# Patient Record
Sex: Female | Born: 1960 | Race: White | Hispanic: No | Marital: Married | Smoking: Never smoker
Health system: Southern US, Community
[De-identification: ages and names within clinical notes are randomized; demographics above are authoritative.]

## PROBLEM LIST (undated history)

## (undated) DIAGNOSIS — D649 Anemia, unspecified: Secondary | ICD-10-CM

## (undated) DIAGNOSIS — E559 Vitamin D deficiency, unspecified: Secondary | ICD-10-CM

## (undated) DIAGNOSIS — M1990 Unspecified osteoarthritis, unspecified site: Secondary | ICD-10-CM

## (undated) DIAGNOSIS — R609 Edema, unspecified: Secondary | ICD-10-CM

## (undated) DIAGNOSIS — I10 Essential (primary) hypertension: Secondary | ICD-10-CM

## (undated) DIAGNOSIS — Z9289 Personal history of other medical treatment: Secondary | ICD-10-CM

## (undated) DIAGNOSIS — F419 Anxiety disorder, unspecified: Secondary | ICD-10-CM

## (undated) DIAGNOSIS — M069 Rheumatoid arthritis, unspecified: Secondary | ICD-10-CM

## (undated) DIAGNOSIS — Z9889 Other specified postprocedural states: Secondary | ICD-10-CM

## (undated) DIAGNOSIS — I319 Disease of pericardium, unspecified: Secondary | ICD-10-CM

## (undated) DIAGNOSIS — K829 Disease of gallbladder, unspecified: Secondary | ICD-10-CM

## (undated) DIAGNOSIS — K5792 Diverticulitis of intestine, part unspecified, without perforation or abscess without bleeding: Secondary | ICD-10-CM

## (undated) HISTORY — DX: Edema, unspecified: R60.9

## (undated) HISTORY — DX: Disease of gallbladder, unspecified: K82.9

## (undated) HISTORY — DX: Rheumatoid arthritis, unspecified: M06.9

## (undated) HISTORY — PX: BACK SURGERY: SHX140

## (undated) HISTORY — DX: Vitamin D deficiency, unspecified: E55.9

## (undated) HISTORY — PX: ABDOMINAL HYSTERECTOMY: SHX81

## (undated) HISTORY — PX: ROTATOR CUFF REPAIR: SHX139

## (undated) HISTORY — PX: CHOLECYSTECTOMY: SHX55

---

## 1998-08-06 ENCOUNTER — Observation Stay (HOSPITAL_COMMUNITY): Admission: RE | Admit: 1998-08-06 | Discharge: 1998-08-07 | Payer: Self-pay | Admitting: Neurosurgery

## 1998-09-25 ENCOUNTER — Other Ambulatory Visit: Admission: RE | Admit: 1998-09-25 | Discharge: 1998-09-25 | Payer: Self-pay | Admitting: Obstetrics and Gynecology

## 1998-10-12 ENCOUNTER — Inpatient Hospital Stay (HOSPITAL_COMMUNITY): Admission: RE | Admit: 1998-10-12 | Discharge: 1998-10-14 | Payer: Self-pay | Admitting: Obstetrics and Gynecology

## 1998-10-26 ENCOUNTER — Emergency Department (HOSPITAL_COMMUNITY): Admission: EM | Admit: 1998-10-26 | Discharge: 1998-10-27 | Payer: Self-pay | Admitting: Emergency Medicine

## 1998-12-03 ENCOUNTER — Inpatient Hospital Stay (HOSPITAL_COMMUNITY): Admission: EM | Admit: 1998-12-03 | Discharge: 1998-12-05 | Payer: Self-pay | Admitting: Emergency Medicine

## 1998-12-04 ENCOUNTER — Encounter: Payer: Self-pay | Admitting: Urology

## 2002-08-26 ENCOUNTER — Emergency Department (HOSPITAL_COMMUNITY): Admission: EM | Admit: 2002-08-26 | Discharge: 2002-08-26 | Payer: Self-pay | Admitting: Emergency Medicine

## 2002-08-26 ENCOUNTER — Encounter: Payer: Self-pay | Admitting: Emergency Medicine

## 2005-02-05 ENCOUNTER — Ambulatory Visit: Payer: Self-pay | Admitting: Family Medicine

## 2007-08-19 ENCOUNTER — Ambulatory Visit (HOSPITAL_COMMUNITY): Admission: RE | Admit: 2007-08-19 | Discharge: 2007-08-19 | Payer: Self-pay | Admitting: Family Medicine

## 2007-08-25 ENCOUNTER — Ambulatory Visit (HOSPITAL_COMMUNITY): Admission: RE | Admit: 2007-08-25 | Discharge: 2007-08-25 | Payer: Self-pay | Admitting: Family Medicine

## 2008-09-12 ENCOUNTER — Ambulatory Visit (HOSPITAL_COMMUNITY): Admission: RE | Admit: 2008-09-12 | Discharge: 2008-09-12 | Payer: Self-pay | Admitting: Family Medicine

## 2010-08-18 ENCOUNTER — Encounter: Payer: Self-pay | Admitting: Family Medicine

## 2014-03-27 DIAGNOSIS — Z6841 Body Mass Index (BMI) 40.0 and over, adult: Secondary | ICD-10-CM | POA: Insufficient documentation

## 2014-05-06 DIAGNOSIS — K5730 Diverticulosis of large intestine without perforation or abscess without bleeding: Secondary | ICD-10-CM | POA: Insufficient documentation

## 2014-05-09 DIAGNOSIS — D126 Benign neoplasm of colon, unspecified: Secondary | ICD-10-CM | POA: Insufficient documentation

## 2014-06-29 DIAGNOSIS — E785 Hyperlipidemia, unspecified: Secondary | ICD-10-CM | POA: Insufficient documentation

## 2014-07-05 DIAGNOSIS — N390 Urinary tract infection, site not specified: Secondary | ICD-10-CM | POA: Insufficient documentation

## 2014-07-05 DIAGNOSIS — R339 Retention of urine, unspecified: Secondary | ICD-10-CM | POA: Insufficient documentation

## 2015-02-26 DIAGNOSIS — I319 Disease of pericardium, unspecified: Secondary | ICD-10-CM

## 2015-02-26 HISTORY — DX: Disease of pericardium, unspecified: I31.9

## 2015-03-02 ENCOUNTER — Emergency Department (HOSPITAL_COMMUNITY): Payer: Self-pay

## 2015-03-02 ENCOUNTER — Encounter (HOSPITAL_COMMUNITY): Payer: Self-pay | Admitting: Emergency Medicine

## 2015-03-02 ENCOUNTER — Inpatient Hospital Stay (HOSPITAL_COMMUNITY)
Admission: EM | Admit: 2015-03-02 | Discharge: 2015-03-04 | DRG: 287 | Disposition: A | Discharge status: Home / Self Care | Payer: Self-pay | Attending: Cardiology | Admitting: Cardiology

## 2015-03-02 ENCOUNTER — Encounter (HOSPITAL_COMMUNITY)
Admission: EM | Disposition: A | Discharge status: Home / Self Care | Payer: Self-pay | Source: Home / Self Care | Attending: Cardiology

## 2015-03-02 DIAGNOSIS — Z792 Long term (current) use of antibiotics: Secondary | ICD-10-CM

## 2015-03-02 DIAGNOSIS — I309 Acute pericarditis, unspecified: Principal | ICD-10-CM | POA: Diagnosis present

## 2015-03-02 DIAGNOSIS — R079 Chest pain, unspecified: Secondary | ICD-10-CM | POA: Diagnosis present

## 2015-03-02 DIAGNOSIS — R9431 Abnormal electrocardiogram [ECG] [EKG]: Secondary | ICD-10-CM | POA: Diagnosis present

## 2015-03-02 DIAGNOSIS — D649 Anemia, unspecified: Secondary | ICD-10-CM | POA: Diagnosis present

## 2015-03-02 DIAGNOSIS — I200 Unstable angina: Secondary | ICD-10-CM

## 2015-03-02 DIAGNOSIS — Z6841 Body Mass Index (BMI) 40.0 and over, adult: Secondary | ICD-10-CM

## 2015-03-02 HISTORY — DX: Anemia, unspecified: D64.9

## 2015-03-02 HISTORY — DX: Personal history of other medical treatment: Z92.89

## 2015-03-02 HISTORY — DX: Diverticulitis of intestine, part unspecified, without perforation or abscess without bleeding: K57.92

## 2015-03-02 HISTORY — DX: Disease of pericardium, unspecified: I31.9

## 2015-03-02 HISTORY — DX: Other specified postprocedural states: Z98.890

## 2015-03-02 HISTORY — PX: CARDIAC CATHETERIZATION: SHX172

## 2015-03-02 LAB — BASIC METABOLIC PANEL WITH GFR
Anion gap: 13 (ref 5–15)
BUN: 8 mg/dL (ref 6–20)
CO2: 24 mmol/L (ref 22–32)
Calcium: 9 mg/dL (ref 8.9–10.3)
Chloride: 102 mmol/L (ref 101–111)
Creatinine, Ser: 0.8 mg/dL (ref 0.44–1.00)
GFR calc Af Amer: >60 mL/min
GFR calc non Af Amer: >60 mL/min
Glucose, Bld: 97 mg/dL (ref 65–99)
Potassium: 3.4 mmol/L — ABNORMAL LOW (ref 3.5–5.1)
Sodium: 139 mmol/L (ref 135–145)

## 2015-03-02 LAB — CBC
HCT: 37.4 % (ref 36.0–46.0)
HEMATOCRIT: 34.4 % — AB (ref 36.0–46.0)
HEMOGLOBIN: 11.5 g/dL — AB (ref 12.0–15.0)
Hemoglobin: 12.5 g/dL (ref 12.0–15.0)
MCH: 28.4 pg (ref 26.0–34.0)
MCH: 28.5 pg (ref 26.0–34.0)
MCHC: 33.4 g/dL (ref 30.0–36.0)
MCHC: 33.4 g/dL (ref 30.0–36.0)
MCV: 85 fL (ref 78.0–100.0)
MCV: 85.4 fL (ref 78.0–100.0)
Platelets: 239 K/uL (ref 150–400)
Platelets: 219 10*3/uL (ref 150–400)
RBC: 4.4 MIL/uL (ref 3.87–5.11)
RBC: 4.03 MIL/uL (ref 3.87–5.11)
RDW: 13.2 % (ref 11.5–15.5)
RDW: 13.4 % (ref 11.5–15.5)
WBC: 6.2 K/uL (ref 4.0–10.5)
WBC: 7.7 10*3/uL (ref 4.0–10.5)

## 2015-03-02 LAB — TROPONIN I
Troponin I: <0.03 ng/mL
Troponin I: <0.03 ng/mL (ref <0.031)

## 2015-03-02 LAB — I-STAT TROPONIN, ED
TROPONIN I, POC: 0 ng/mL (ref 0.00–0.08)
Troponin i, poc: 0 ng/mL (ref 0.00–0.08)

## 2015-03-02 LAB — CREATININE, SERUM
CREATININE: 0.8 mg/dL (ref 0.44–1.00)
GFR calc Af Amer: >60 mL/min (ref >60)
GFR calc non Af Amer: >60 mL/min (ref >60)

## 2015-03-02 LAB — PROTIME-INR
INR: 1.07 (ref 0.00–1.49)
PROTHROMBIN TIME: 14.1 s (ref 11.6–15.2)

## 2015-03-02 LAB — D-DIMER, QUANTITATIVE: D-Dimer, Quant: 0.88 ug{FEU}/mL — ABNORMAL HIGH (ref 0.00–0.48)

## 2015-03-02 LAB — BRAIN NATRIURETIC PEPTIDE: B Natriuretic Peptide: 63.3 pg/mL (ref 0.0–100.0)

## 2015-03-02 LAB — APTT: aPTT: 28 seconds (ref 24–37)

## 2015-03-02 LAB — TSH: TSH: 2.831 u[IU]/mL (ref 0.350–4.500)

## 2015-03-02 SURGERY — LEFT HEART CATH AND CORONARY ANGIOGRAPHY

## 2015-03-02 MED ORDER — METRONIDAZOLE 500 MG PO TABS
500.0000 mg | ORAL_TABLET | Freq: Three times a day (TID) | ORAL | Status: DC
  Administered 2015-03-02 – 2015-03-04 (×5): 500 mg via ORAL
  Filled 2015-03-02 (×4): qty 1

## 2015-03-02 MED ORDER — SODIUM CHLORIDE 0.9 % IJ SOLN: 3.0000 mL | INTRAMUSCULAR | Status: DC | PRN

## 2015-03-02 MED ORDER — SODIUM CHLORIDE 0.9 % IV SOLN: 250.0000 mL | INTRAVENOUS | Status: DC | PRN

## 2015-03-02 MED ORDER — POLYETHYLENE GLYCOL 3350 17 G PO PACK
17.0000 g | PACK | Freq: Every day | ORAL | Status: DC
  Administered 2015-03-04: 17 g via ORAL
  Filled 2015-03-02: qty 1

## 2015-03-02 MED ORDER — ASPIRIN EC 81 MG PO TBEC
81.0000 mg | DELAYED_RELEASE_TABLET | Freq: Every day | ORAL | Status: DC
  Administered 2015-03-03 – 2015-03-04 (×2): 81 mg via ORAL
  Filled 2015-03-02: qty 1

## 2015-03-02 MED ORDER — HEPARIN (PORCINE) IN NACL 100-0.45 UNIT/ML-% IJ SOLN
1100.0000 [IU]/h | INTRAMUSCULAR | Status: DC
  Administered 2015-03-02: 1100 [IU]/h via INTRAVENOUS
  Filled 2015-03-02: qty 250

## 2015-03-02 MED ORDER — SODIUM CHLORIDE 0.9 % IJ SOLN: 3.0000 mL | Freq: Two times a day (BID) | INTRAMUSCULAR | Status: DC

## 2015-03-02 MED ORDER — ONDANSETRON HCL 4 MG/2ML IJ SOLN
4.0000 mg | Freq: Once | INTRAMUSCULAR | Status: AC
  Administered 2015-03-02: 4 mg via INTRAVENOUS
  Filled 2015-03-02: qty 2

## 2015-03-02 MED ORDER — NITROGLYCERIN 1 MG/10 ML FOR IR/CATH LAB
INTRA_ARTERIAL | Status: AC
  Filled 2015-03-02: qty 10

## 2015-03-02 MED ORDER — HEPARIN SODIUM (PORCINE) 1000 UNIT/ML IJ SOLN
INTRAMUSCULAR | Status: DC | PRN
  Administered 2015-03-02: 6000 [IU] via INTRAVENOUS

## 2015-03-02 MED ORDER — FENTANYL CITRATE (PF) 100 MCG/2ML IJ SOLN
INTRAMUSCULAR | Status: DC | PRN
  Administered 2015-03-02: 50 ug via INTRAVENOUS

## 2015-03-02 MED ORDER — ASPIRIN 81 MG PO CHEW
324.0000 mg | CHEWABLE_TABLET | ORAL | Status: DC
Start: 2015-03-02 — End: 2015-03-02

## 2015-03-02 MED ORDER — ATORVASTATIN CALCIUM 80 MG PO TABS: 80.0000 mg | ORAL_TABLET | Freq: Every day | ORAL | Status: DC

## 2015-03-02 MED ORDER — LORAZEPAM 2 MG/ML IJ SOLN
1.0000 mg | Freq: Once | INTRAMUSCULAR | Status: AC
  Administered 2015-03-02: 1 mg via INTRAVENOUS
  Filled 2015-03-02: qty 1

## 2015-03-02 MED ORDER — SODIUM CHLORIDE 0.9 % WEIGHT BASED INFUSION: 3.0000 mL/kg/h | INTRAVENOUS | Status: AC

## 2015-03-02 MED ORDER — ENOXAPARIN SODIUM 40 MG/0.4ML ~~LOC~~ SOLN
40.0000 mg | SUBCUTANEOUS | Status: DC
  Administered 2015-03-03 – 2015-03-04 (×2): 40 mg via SUBCUTANEOUS
  Filled 2015-03-02 (×2): qty 0.4

## 2015-03-02 MED ORDER — OXYCODONE-ACETAMINOPHEN 5-325 MG PO TABS
1.0000 | ORAL_TABLET | ORAL | Status: DC | PRN
  Administered 2015-03-02 – 2015-03-04 (×6): 2 via ORAL
  Filled 2015-03-02 (×6): qty 2

## 2015-03-02 MED ORDER — MORPHINE SULFATE 4 MG/ML IJ SOLN
4.0000 mg | Freq: Once | INTRAMUSCULAR | Status: AC
  Administered 2015-03-02: 4 mg via INTRAVENOUS
  Filled 2015-03-02: qty 1

## 2015-03-02 MED ORDER — LIDOCAINE VISCOUS 2 % MT SOLN
15.0000 mL | Freq: Once | OROMUCOSAL | Status: AC
  Administered 2015-03-02: 15 mL via OROMUCOSAL
  Filled 2015-03-02: qty 15

## 2015-03-02 MED ORDER — SODIUM CHLORIDE 0.9 % IV SOLN: INTRAVENOUS | Status: AC

## 2015-03-02 MED ORDER — SODIUM CHLORIDE 0.9 % IJ SOLN
INTRAMUSCULAR | Status: DC | PRN
  Administered 2015-03-02: 18:00:00 via INTRA_ARTERIAL

## 2015-03-02 MED ORDER — LIDOCAINE HCL (PF) 1 % IJ SOLN
INTRAMUSCULAR | Status: AC
  Filled 2015-03-02: qty 30

## 2015-03-02 MED ORDER — HEPARIN BOLUS VIA INFUSION
4000.0000 [IU] | Freq: Once | INTRAVENOUS | Status: AC
  Administered 2015-03-02: 4000 [IU] via INTRAVENOUS
  Filled 2015-03-02: qty 4000

## 2015-03-02 MED ORDER — PANTOPRAZOLE SODIUM 40 MG IV SOLR
40.0000 mg | Freq: Once | INTRAVENOUS | Status: AC
  Administered 2015-03-02: 40 mg via INTRAVENOUS
  Filled 2015-03-02: qty 40

## 2015-03-02 MED ORDER — MIDAZOLAM HCL 2 MG/2ML IJ SOLN
INTRAMUSCULAR | Status: AC
  Filled 2015-03-02: qty 4

## 2015-03-02 MED ORDER — ALUM & MAG HYDROXIDE-SIMETH 200-200-20 MG/5ML PO SUSP
15.0000 mL | Freq: Once | ORAL | Status: AC
  Administered 2015-03-02: 15 mL via ORAL
  Filled 2015-03-02: qty 30

## 2015-03-02 MED ORDER — LIDOCAINE HCL (PF) 1 % IJ SOLN
INTRAMUSCULAR | Status: DC | PRN
  Administered 2015-03-02: 18:00:00

## 2015-03-02 MED ORDER — ACETAMINOPHEN 325 MG PO TABS: 650.0000 mg | ORAL_TABLET | ORAL | Status: DC | PRN

## 2015-03-02 MED ORDER — IOHEXOL 350 MG/ML SOLN
100.0000 mL | Freq: Once | INTRAVENOUS | Status: AC | PRN
  Administered 2015-03-02: 100 mL via INTRAVENOUS

## 2015-03-02 MED ORDER — HEPARIN (PORCINE) IN NACL 2-0.9 UNIT/ML-% IJ SOLN
INTRAMUSCULAR | Status: AC
  Filled 2015-03-02: qty 1000

## 2015-03-02 MED ORDER — HEPARIN (PORCINE) IN NACL 2-0.9 UNIT/ML-% IJ SOLN
INTRAMUSCULAR | Status: AC
  Filled 2015-03-02: qty 500

## 2015-03-02 MED ORDER — HEPARIN SODIUM (PORCINE) 1000 UNIT/ML IJ SOLN
INTRAMUSCULAR | Status: AC
  Filled 2015-03-02: qty 1

## 2015-03-02 MED ORDER — VERAPAMIL HCL 2.5 MG/ML IV SOLN
INTRAVENOUS | Status: AC
  Filled 2015-03-02: qty 2

## 2015-03-02 MED ORDER — CIPROFLOXACIN HCL 500 MG PO TABS
500.0000 mg | ORAL_TABLET | Freq: Two times a day (BID) | ORAL | Status: DC
  Administered 2015-03-02 – 2015-03-04 (×4): 500 mg via ORAL
  Filled 2015-03-02 (×4): qty 1

## 2015-03-02 MED ORDER — MIDAZOLAM HCL 2 MG/2ML IJ SOLN
INTRAMUSCULAR | Status: DC | PRN
  Administered 2015-03-02: 1 mg via INTRAVENOUS

## 2015-03-02 MED ORDER — ONDANSETRON HCL 4 MG/2ML IJ SOLN: 4.0000 mg | Freq: Four times a day (QID) | INTRAMUSCULAR | Status: DC | PRN

## 2015-03-02 MED ORDER — MORPHINE SULFATE 4 MG/ML IJ SOLN
8.0000 mg | Freq: Once | INTRAMUSCULAR | Status: AC
  Administered 2015-03-02: 8 mg via INTRAVENOUS
  Filled 2015-03-02: qty 2

## 2015-03-02 MED ORDER — NITROGLYCERIN 0.4 MG SL SUBL
0.4000 mg | SUBLINGUAL_TABLET | SUBLINGUAL | Status: DC | PRN
  Administered 2015-03-02: 0.4 mg via SUBLINGUAL
  Filled 2015-03-02: qty 1

## 2015-03-02 MED ORDER — IOHEXOL 350 MG/ML SOLN
INTRAVENOUS | Status: DC | PRN
  Administered 2015-03-02: 80 mL via INTRACARDIAC

## 2015-03-02 MED ORDER — ASPIRIN 300 MG RE SUPP: 300.0000 mg | RECTAL | Status: DC

## 2015-03-02 MED ORDER — FENTANYL CITRATE (PF) 100 MCG/2ML IJ SOLN
INTRAMUSCULAR | Status: AC
  Filled 2015-03-02: qty 4

## 2015-03-02 SURGICAL SUPPLY — 12 items
CATH INFINITI 5 FR JL3.5 (CATHETERS) ×2 IMPLANT
CATH INFINITI JR4 5F (CATHETERS) ×2 IMPLANT
CATH SITESEER 5F MULTI A 2 (CATHETERS) IMPLANT
DEVICE RAD COMP TR BAND LRG (VASCULAR PRODUCTS) ×2 IMPLANT
GLIDESHEATH SLEND A-KIT 6F 22G (SHEATH) ×2 IMPLANT
KIT HEART LEFT (KITS) ×2 IMPLANT
PACK CARDIAC CATHETERIZATION (CUSTOM PROCEDURE TRAY) ×2 IMPLANT
SHEATH PINNACLE 5F 10CM (SHEATH) IMPLANT
TRANSDUCER W/STOPCOCK (MISCELLANEOUS) ×2 IMPLANT
TUBING CIL FLEX 10 FLL-RA (TUBING) ×2 IMPLANT
WIRE EMERALD 3MM-J .035X150CM (WIRE) IMPLANT
WIRE SAFE-T 1.5MM-J .035X260CM (WIRE) ×2 IMPLANT

## 2015-03-02 NOTE — ED Provider Notes (Signed)
CSN: [id number]     Arrival date & time 03/02/15  0350 History   First MD Initiated Contact with Patient 03/02/15 [phone]     Chief Complaint  Patient presents with  . Chest Pain     (Consider location/radiation/quality/duration/timing/severity/associated sxs/prior Treatment) Patient is a 54 y.o. female presenting with chest pain. The history is provided by the patient and the spouse.  Chest Pain Pain location:  Substernal area Pain quality: crushing, dull and pressure   Pain radiates to:  Does not radiate Pain radiates to the back: no   Pain severity:  Severe Onset quality:  Sudden Duration:  2 hours Timing:  Constant Progression:  Unchanged Chronicity:  New Relieved by:  Nothing Exacerbated by: lying flat. Ineffective treatments:  None tried Associated symptoms: nausea and shortness of breath   Associated symptoms: no dizziness, no fever, no headache, no palpitations and not vomiting   Risk factors: obesity   Risk factors: no coronary artery disease, no diabetes mellitus, no high cholesterol and no hypertension    54 yo F with a chief complaint of chest pain. This woke her from sleep about 4:30 this morning. The pain has no radiation feels like a pressure associated with shortness of breath and nausea. Patient was given 324 of aspirin and 2 sublingual nitros with some improvement of her pain. Patient's pain is still 8 out of 10. Patient is holding onto her chest saying that it hurts. Denies any recent injuries denies new exercises. Denies cough fevers. Patient was recently admitted into hospital for observation last week. Patient denies any other recent immobilization. Patient denies estrogen use. Denies smoking denies diabetes denies hyperlipidemia. Possible family history of MI in father at 4 but patient unsure.  Past Medical History  Diagnosis Date  . Diverticulitis    Past Surgical History  Procedure Laterality Date  . Back surgery    . Cholecystectomy    . Abdominal  hysterectomy    . Rotator cuff repair Right    History reviewed. No pertinent family history. History  Substance Use Topics  . Smoking status: Never Smoker   . Smokeless tobacco: Not on file  . Alcohol Use: No   OB History    No data available     Review of Systems  Constitutional: Negative for fever and chills.  HENT: Negative for congestion and rhinorrhea.   Eyes: Negative for redness and visual disturbance.  Respiratory: Positive for shortness of breath. Negative for wheezing.   Cardiovascular: Positive for chest pain. Negative for palpitations.  Gastrointestinal: Positive for nausea. Negative for vomiting.  Genitourinary: Negative for dysuria and urgency.  Musculoskeletal: Negative for myalgias and arthralgias.  Skin: Negative for pallor and wound.  Neurological: Negative for dizziness and headaches.      Allergies  Review of patient's allergies indicates no known allergies.  Home Medications   Prior to Admission medications   Not on File   BP 138/76 mmHg  Pulse 81  Temp(Src) 98.3 F (36.8 C) (Oral)  Resp 16  SpO2 100% Physical Exam  Constitutional: She is oriented to person, place, and time. She appears well-developed and well-nourished. No distress.  HENT:  Head: Normocephalic and atraumatic.  Eyes: EOM are normal. Pupils are equal, round, and reactive to light.  Neck: Normal range of motion. Neck supple.  Cardiovascular: Normal rate and regular rhythm.  Exam reveals no gallop and no friction rub.   No murmur heard. Pulmonary/Chest: Effort normal. She has no wheezes. She has no rales. She exhibits tenderness (  TTP about the mid sternum).  Abdominal: Soft. She exhibits no distension. There is tenderness (mild epigastric). There is no rebound and no guarding.  Musculoskeletal: She exhibits no edema or tenderness.  Obese difficult to ascertain edema  Neurological: She is alert and oriented to person, place, and time.  Skin: Skin is warm and dry. She is not  diaphoretic.  Psychiatric: She has a normal mood and affect. Her behavior is normal.    ED Course  Procedures (including critical care time) Labs Review Labs Reviewed  BASIC METABOLIC PANEL  CBC    Imaging Review No results found.   EKG Interpretation   Date/Time:  Friday March 02 2015 06:39:49 EDT Ventricular Rate:  83 PR Interval:  145 QRS Duration: 106 QT Interval:  413 QTC Calculation: 485 R Axis:   10 Text Interpretation:  Sinus rhythm RSR' in V1 or V2, right VCD or RVH  Borderline prolonged QT interval Baseline wander in lead(s) II III aVL aVF  V2 since last tracing no significant change Confirmed by MILLER  MD, BRIAN  ([id number]) on 03/02/2015 6:42:32 AM      MDM   Final diagnoses:  None    Patient is a 54 y.o. female who presents with chest pain.  Hear score of 3.  Will delta troponin. Patient with recent hospitalization as well as difficult to ascertain edema will obtain d-dimer.   D-dimer elevated CT PET scan negative. Patient with continued persistent chest pain. Patient refusing nitroglycerin. Given 16 mg of morphine with no change of pain. Delta troponin negative. Those were performed via i-STAT. Will check laboratory troponin. Serial EKGs without further changes.  Discussed case with cardiology. Feel that this may be unstable angina. Patient with continued negative troponins. Cardiology will admit to the hospital.  The patients results and plan were reviewed and discussed.   Any x-rays performed were independently reviewed by myself.   Differential diagnosis were considered with the presenting HPI.  Medications  nitroGLYCERIN (NITROSTAT) SL tablet 0.4 mg (0.4 mg Sublingual Given 03/02/15 0739)  ciprofloxacin (CIPRO) tablet 500 mg (not administered)  metroNIDAZOLE (FLAGYL) tablet 500 mg (0 mg Oral Hold 03/02/15 1559)  polyethylene glycol (MIRALAX / GLYCOLAX) packet 17 g (0 g Oral Hold 03/02/15 1559)  aspirin chewable tablet 324 mg (324 mg Oral Not Given 03/02/15  1521)    Or  aspirin suppository 300 mg ( Rectal See Alternative 03/02/15 1521)  aspirin EC tablet 81 mg (not administered)  acetaminophen (TYLENOL) tablet 650 mg (not administered)  ondansetron (ZOFRAN) injection 4 mg (not administered)  atorvastatin (LIPITOR) tablet 80 mg (not administered)  heparin ADULT infusion 100 units/mL (25000 units/250 mL) (1,100 Units/hr Intravenous New Bag/Given 03/02/15 1557)  morphine 4 MG/ML injection 4 mg (4 mg Intravenous Given 03/02/15 0739)  alum & mag hydroxide-simeth (MAALOX/MYLANTA) 200-200-20 MG/5ML suspension 15 mL (15 mLs Oral Given 03/02/15 0739)  lidocaine (XYLOCAINE) 2 % viscous mouth solution 15 mL (15 mLs Mouth/Throat Given 03/02/15 0739)  ondansetron (ZOFRAN) injection 4 mg (4 mg Intravenous Given 03/02/15 0739)  morphine 4 MG/ML injection 4 mg (4 mg Intravenous Given 03/02/15 0815)  iohexol (OMNIPAQUE) 350 MG/ML injection 100 mL (100 mLs Intravenous Contrast Given 03/02/15 0907)  morphine 4 MG/ML injection 8 mg (8 mg Intravenous Given 03/02/15 0930)  ondansetron (ZOFRAN) injection 4 mg (4 mg Intravenous Given 03/02/15 0930)  pantoprazole (PROTONIX) injection 40 mg (40 mg Intravenous Given 03/02/15 1123)  LORazepam (ATIVAN) injection 1 mg (1 mg Intravenous Given 03/02/15 1124)  ondansetron (ZOFRAN) injection  4 mg (4 mg Intravenous Given 03/02/15 1349)  heparin bolus via infusion 4,000 Units (4,000 Units Intravenous Given 03/02/15 1558)    Filed Vitals:   03/02/15 1415 03/02/15 1430 03/02/15 1500 03/02/15 1533  BP: 117/58 108/62 106/62 121/63  Pulse: 77 76 82 81  Temp:      TempSrc:      Resp: 22 20 13 18   Height:    5\' 8"  (1.727 m)  Weight:    270 lb (122.471 kg)  SpO2: 98% 96% 100% 97%    Final diagnoses:  Chest pain    Admission/ observation were discussed with the admitting physician, patient and/or family and they are comfortable with the plan.    Deno Etienne, DO 03/02/15 [phone]

## 2015-03-02 NOTE — Interval H&P Note (Signed)
Cath Lab Visit (complete for each Cath Lab visit)  Clinical Evaluation Leading to the Procedure:   ACS: Yes.    Non-ACS:    Anginal Classification: CCS IV  Anti-ischemic medical therapy: No Therapy  Non-Invasive Test Results: No non-invasive testing performed  Prior CABG: No previous CABG      History and Physical Interval Note:  03/02/2015 5:19 PM  Jenna Mccann  has presented today for surgery, with the diagnosis of cp  The various methods of treatment have been discussed with the patient and family. After consideration of risks, benefits and other options for treatment, the patient has consented to  Procedure(s): Left Heart Cath and Coronary Angiography (N/A) as a surgical intervention .  The patient's history has been reviewed, patient examined, no change in status, stable for surgery.  I have reviewed the patient's chart and labs.  Questions were answered to the patient's satisfaction.     Sinclair Grooms

## 2015-03-02 NOTE — ED Notes (Signed)
MD at bedside. 

## 2015-03-02 NOTE — H&P (Signed)
CARDIOLOGY ADMISSION HISTORY & PHYSICAL NOTE.  NAME:  Jenna Mccann   MRN: [id number] DOB:  [birth year]   ADMIT DATE: 03/02/2015  Reason for Consult: PROLONGED CHEST PAIN  Requesting Physician: EDP - Dr. Tyrone Nine.  Primary Cardiologist: N/A  HPI: This is a 54 y.o. female with a past medical history significant for recent hospitalization for diverticulitis. She was recovering and doing relatively well until this morning when a roughly 4:30 the morning she woke up with severe left-sided chest pressure, "like an elephant sitting on her chest".  The symptom is been present essentially ever since with some waxing and waning of in severity. It is made worse with ambulation. It is also exacerbated with lying down flat which also brings on significant orthopnea. She is had elderly significant nausea. She just came back from the bathroom and noted that her discomfort was worse as was nausea. She actually had an episode of emesis in front of me. In the emergency room, she has had one set of POC and was a regular enzymes 3 hours apart that were negative. We were contacted because her EKG has downsloping ST depression with T-wave inversions in leads III, V3-4 .  The most recent EKG we have on her was from 2000 and there were the similar findings on lead III but not in the precordial leads.  She is significantly obese (likely morbidly obese. Has no history of diabetes, hypertension or hyperlipidemia that is being treated.  PMHx:  CARDIAC HISTORY: no prior history.  Past Medical History  Diagnosis Date  . Diverticulitis    Past Surgical History  Procedure Laterality Date  . Back surgery    . Cholecystectomy    . Abdominal hysterectomy    . Rotator cuff repair Right     FAMHx:History reviewed. No pertinent family history.  she is not aware of any premature CAD  SOCHx:  reports that she has never smoked. She does not have any smokeless tobacco history on file. She reports that she does not drink  alcohol. Her drug history is not on file.  ALLERGIES: No Known Allergies  Review of Systems  Constitutional: Positive for malaise/fatigue (Today only).  HENT: Negative for congestion and nosebleeds.   Eyes: Negative for blurred vision.  Respiratory: Positive for shortness of breath. Negative for cough and wheezing.   Cardiovascular: Positive for chest pain, orthopnea and PND. Negative for palpitations (.hprob), claudication and leg swelling.  Gastrointestinal: Positive for nausea, vomiting, diarrhea (recently with her diverticulitis but none in the last week) and melena. Negative for abdominal pain, constipation and blood in stool.  Genitourinary: Negative for dysuria and hematuria.  Neurological: Negative for headaches.  Endo/Heme/Allergies: Bruises/bleeds easily.  All other systems reviewed and are negative.   HOME MEDICATIONS: Prior to Admission medications   Medication Sig Start Date End Date Taking? Authorizing Provider  ciprofloxacin (CIPRO) 500 MG tablet Take 500 mg by mouth 2 (two) times daily. 14 day course started 02/21/15   Yes Historical Provider, MD  ibuprofen (ADVIL,MOTRIN) 200 MG tablet Take 600 mg by mouth daily as needed (pain).   Yes Historical Provider, MD  metroNIDAZOLE (FLAGYL) 500 MG tablet Take 500 mg by mouth every 8 (eight) hours. 14 day course started 02/21/15   Yes Historical Provider, MD  polyethylene glycol (MIRALAX / GLYCOLAX) packet Take 17 g by mouth daily.   Yes Historical Provider, MD    HOSPITAL MEDICATIONS:  Given SL NTG x 1 (also by EMS)  VITALS: Blood pressure 108/62, pulse 76,  temperature 98.3 F (36.8 C), temperature source Oral, resp. rate 20, SpO2 96 %. PHYSICAL EXAM: General appearance: alert, cooperative, moderate distress, morbidly obese and Seems very comfortable, borderline toxic but with stable blood pressures. His moaning with pain.  Subdued mood and affect Neck: no adenopathy, no carotid bruit, no JVD, supple, symmetrical, trachea  midline and Cannot adequately assess JVP due to body habitus Lungs: Mild bibasal rales. Otherwise CTA B nonlabored Heart: Very distant heart sounds. Unable to auscultate any murmurs or rubs. I cannot exclude S4 gallop. Normal S1 and S2. Abdomen: soft, non-tender; bowel sounds normal; no masses,  no organomegaly and Morbidly obese Extremities: extremities normal, atraumatic, no cyanosis or edema Pulses: 2+ and symmetric Skin: Skin color, texture, turgor normal. No rashes or lesions Neurologic: Mental status: alertness: But very fatigued appearing. Not necessarily lethargic, orientation: time, date, person, place, city, president, affect: blunted Cranial nerves: normal  MSK: She does have some reproducibility of her left-sided chest pain sternal margin. This is somewhat like her symptom, but does not induce her dyspnea as well.  LABS: Results for orders placed or performed during the hospital encounter of 03/02/15 (from the past 24 hour(s))  Basic metabolic panel     Status: Abnormal   Collection Time: 03/02/15  6:44 AM  Result Value Ref Range   Sodium 139 135 - 145 mmol/L   Potassium 3.4 (L) 3.5 - 5.1 mmol/L   Chloride 102 101 - 111 mmol/L   CO2 24 22 - 32 mmol/L   Glucose, Bld 97 65 - 99 mg/dL   BUN 8 6 - 20 mg/dL   Creatinine, Ser 0.80 0.44 - 1.00 mg/dL   Calcium 9.0 8.9 - 10.3 mg/dL   GFR calc non Af Amer >60 >60 mL/min   GFR calc Af Amer >60 >60 mL/min   Anion gap 13 5 - 15  CBC     Status: None   Collection Time: 03/02/15  6:44 AM  Result Value Ref Range   WBC 6.2 4.0 - 10.5 K/uL   RBC 4.40 3.87 - 5.11 MIL/uL   Hemoglobin 12.5 12.0 - 15.0 g/dL   HCT 37.4 36.0 - 46.0 %   MCV 85.0 78.0 - 100.0 fL   MCH 28.4 26.0 - 34.0 pg   MCHC 33.4 30.0 - 36.0 g/dL   RDW 13.2 11.5 - 15.5 %   Platelets 239 150 - 400 K/uL  D-dimer, quantitative (not at [facility])     Status: Abnormal   Collection Time: 03/02/15  6:44 AM  Result Value Ref Range   D-Dimer, Quant 0.88 (H) 0.00 - 0.48 ug/mL-FEU    Troponin I     Status: None   Collection Time: 03/02/15  6:44 AM  Result Value Ref Range   Troponin I <0.03 <0.031 ng/mL  I-stat troponin, ED     Status: None   Collection Time: 03/02/15  6:49 AM  Result Value Ref Range   Troponin i, poc 0.00 0.00 - 0.08 ng/mL   Comment 3          I-stat troponin, ED     Status: None   Collection Time: 03/02/15  9:47 AM  Result Value Ref Range   Troponin i, poc 0.00 0.00 - 0.08 ng/mL   Comment 3            IMAGING: Dg Chest 2 View  03/02/2015   CLINICAL DATA:  Chest pain, acute  EXAM: CHEST  2 VIEW  COMPARISON:  March 29, 2007  FINDINGS: Lungs  are clear. Heart size and pulmonary vascularity are normal. No adenopathy. No bone lesions. No pneumothorax.  IMPRESSION: No edema or consolidation.   Electronically Signed   By: Lowella Grip III M.D.   On: 03/02/2015 07:35   Ct Angio Chest Pe W/cm &/or Wo Cm  03/02/2015   CLINICAL DATA:  Acute onset midsternal chest pain and shortness of breath  EXAM: CT ANGIOGRAPHY CHEST WITH CONTRAST  TECHNIQUE: Multidetector CT imaging of the chest was performed using the standard protocol during bolus administration of intravenous contrast. Multiplanar CT image reconstructions and MIPs were obtained to evaluate the vascular anatomy.  CONTRAST:  14mL OMNIPAQUE IOHEXOL 350 MG/ML SOLN  COMPARISON:  Chest CT March 17, 2007; chest radiograph March 02, 2015  FINDINGS: There is no demonstrable pulmonary embolus. There is no thoracic aortic aneurysm or dissection. The visualized great vessels appear unremarkable.  There is patchy bibasilar atelectatic change, some of which may be due to dependent positioning. There is no frank edema or consolidation.  Thyroid appears unremarkable. There are scattered subcentimeter mediastinal lymph nodes but no adenopathy by size criteria. There is minimal pericardial thickening without well-defined effusion.  Visualized upper abdominal structures appear unremarkable.  There are no blastic or lytic  bone lesions.  Review of the MIP images confirms the above findings.  IMPRESSION: No demonstrable pulmonary embolus. Bilateral lower lobe atelectatic change, in part due to dependent positioning. No edema or consolidation. No adenopathy. Slight generalized pericardial thickening without effusion. Significance of this mild pericardial thickening is uncertain.   Electronically Signed   By: Lowella Grip III M.D.   On: 03/02/2015 09:47   EKG: NSR, 83 bpm, LAD.  ~ inc RBBB, ST depression with TWI in III, V2-V4 - CRO ischemia  IMPRESSION: Principal Problem:   Unstable angina Active Problems:   Abnormal finding on EKG Morbid Obesity   I'm concerned that she has persistent left-sided chest pain with EKG changes that may or may not be dynamic. This certainly could be related to circumflex ischemia. There was some mild relief with nitroglycerin. She does have nausea and orthopnea which would be concerning for potential ischemic etiology associated with her sent symptom. Other non-typical features of the prolonged episode and lack of complete relief with nitroglycerin.  Unfortunately, she is morbidly obese, and noninvasive stress test is may not be very accurate. With prolonged episode of discomfort and EKG changes, I think the most appropriate course of action is can consider this ACS/unstable angina until proven otherwise. If occlusive CAD is excluded, then would consider the possibility of costochondritis related pain. However this would not explain her nausea and orthopnea. We'll also check 2-D echocardiogram.  There does appear to be some positional nature to her symptoms which means we cannot exclude the possibility of peritonitis pain as well. This would correlate with some orthopnea.  RECOMMENDATION:  At this point I think the most appropriate course of action is to consider this to be ACS/unstable angina and recommended invasive evaluation. As is the most expeditious way of evaluating her  symptoms of ongoing chest discomfort with EKG changes.  I will post her for cardiac catheterization this afternoon. This will give [place] definitive answer as to the presence or absence of occlusive coronary disease.  Check 2-D echocardiogram  Check cardiac risk factors including lipid panel & Hemoglobin A1c -- initiate therapy based upon findings.  We'll hold off on determining whether or not we initiate beta blocker therapy until we see what the cardiac catheterization shows.  Time Spent Directly with Patient: 45 minutes  HARDING, Leonie Green, M.D., M.S. Interventional Cardiologist   Pager # [phone]

## 2015-03-02 NOTE — ED Notes (Signed)
Pt reports being awoken this morning at 04:30 for midsternal chest pain without radiation; pt also sob and nauseated; EMS gave 324ASA and 2 SL nitro- brought pain from 10/10 to 8/10; pt CAOx4 at this time

## 2015-03-02 NOTE — ED Notes (Signed)
Patient transported to X-ray 

## 2015-03-02 NOTE — Progress Notes (Addendum)
ANTICOAGULATION CONSULT NOTE - Initial Consult  Pharmacy Consult for heparin Indication: chest pain/ACS  No Known Allergies  Patient Measurements: Height: 5\' 8"  (172.7 cm) Weight: 270 lb (122.471 kg) IBW/kg (Calculated) : 63.9  Heparin Dosing Weight: 92.6 kg   Vital Signs: Temp: 98.3 F (36.8 C) (08/05 0641) Temp Source: Oral (08/05 0641) BP: 121/63 mmHg (08/05 1533) Pulse Rate: 81 (08/05 1533)  Labs:  Recent Labs  03/02/15 0644  HGB 12.5  HCT 37.4  PLT 239  CREATININE 0.80  TROPONINI <0.03    Estimated Creatinine Clearance: 113.4 mL/min (by C-G formula based on Cr of 0.8).   Medical History: Past Medical History  Diagnosis Date  . Diverticulitis     Medications:   (Not in a hospital admission)  Assessment: 54 yo female admitted with L sided chest pressure. Initiating heparin gtt for rule out ACS. CBC stable and wnl, no anticoagulants pta.  Goal of Therapy:  Heparin level 0.3-0.7 units/ml Monitor platelets by anticoagulation protocol: Yes   Plan:  -Heparin 4000 units x1 then 1100 units/hr -Daily HL, CBC -Monitor s/sx bleeding  -Check confirmatory level this evening  -Anticipate cath this evening    Hughes Better, PharmD, BCPS Clinical Pharmacist Pager: [phone] 03/02/2015 3:41 PM

## 2015-03-03 ENCOUNTER — Inpatient Hospital Stay (HOSPITAL_COMMUNITY): Payer: Self-pay

## 2015-03-03 ENCOUNTER — Other Ambulatory Visit (HOSPITAL_COMMUNITY): Payer: Self-pay

## 2015-03-03 LAB — CBC
HEMATOCRIT: 32.5 % — AB (ref 36.0–46.0)
Hemoglobin: 10.8 g/dL — ABNORMAL LOW (ref 12.0–15.0)
MCH: 28.5 pg (ref 26.0–34.0)
MCHC: 33.2 g/dL (ref 30.0–36.0)
MCV: 85.8 fL (ref 78.0–100.0)
PLATELETS: 216 10*3/uL (ref 150–400)
RBC: 3.79 MIL/uL — AB (ref 3.87–5.11)
RDW: 13.5 % (ref 11.5–15.5)
WBC: 6.1 10*3/uL (ref 4.0–10.5)

## 2015-03-03 LAB — TROPONIN I: Troponin I: <0.03 ng/mL (ref <0.031)

## 2015-03-03 MED ORDER — IBUPROFEN 200 MG PO TABS
400.0000 mg | ORAL_TABLET | Freq: Three times a day (TID) | ORAL | Status: DC
  Administered 2015-03-03 – 2015-03-04 (×3): 400 mg via ORAL
  Filled 2015-03-03 (×4): qty 2

## 2015-03-03 MED ORDER — COLCHICINE 0.6 MG PO TABS
0.6000 mg | ORAL_TABLET | Freq: Every day | ORAL | Status: DC
  Administered 2015-03-03 – 2015-03-04 (×2): 0.6 mg via ORAL
  Filled 2015-03-03 (×2): qty 1

## 2015-03-03 NOTE — Progress Notes (Signed)
  Echocardiogram 2D Echocardiogram has been performed.  Roseanna Rainbow R 03/03/2015, 1:32 PM

## 2015-03-03 NOTE — Progress Notes (Signed)
Subjective:  She complains of a headache this morning that is positional and also complaints of chest pain which is sharp and pleuritic worse when she is lying down and somewhat better but not relieved when sitting up.  Had catheterization showing normal coronary arteries yesterday.  Objective:  Vital Signs in the last 24 hours: BP 101/63 mmHg  Pulse 68  Temp(Src) 98.1 F (36.7 C) (Oral)  Resp 16  Ht 5\' 8"  (1.727 m)  Wt 122.471 kg (270 lb)  BMI 41.06 kg/m2  SpO2 98%  Physical Exam: Obese white female currently in no acute distress but complaining of chest pain Lungs:  Clear Cardiac:  Regular rhythm, normal S1 and S2, no S3, no rub heard Abdomen:  Soft, nontender, no masses Extremities: Radial catheterization site clean and dry without hematoma  Intake/Output from previous day:    Weight Filed Weights   03/02/15 1533  Weight: 122.471 kg (270 lb)    Lab Results: Basic Metabolic Panel:  Recent Labs  03/02/15 0644 03/02/15 2051  NA 139  --   K 3.4*  --   CL 102  --   CO2 24  --   GLUCOSE 97  --   BUN 8  --   CREATININE 0.80 0.80   CBC:  Recent Labs  03/02/15 2051 03/03/15 0303  WBC 7.7 6.1  HGB 11.5* 10.8*  HCT 34.4* 32.5*  MCV 85.4 85.8  PLT 219 216   Cardiac Enzymes: Troponin (Point of Care Test)  Recent Labs  03/02/15 0947  TROPIPOC 0.00   Cardiac Panel (last 3 results)  Recent Labs  03/02/15 1530 03/02/15 2051 03/03/15 0303  TROPONINI <0.03 <0.03 <0.03    Telemetry: Sinus rhythm  Assessment/Plan:  1.  Chest discomfort, pleuritic and positional with normal coronary arteries-she has an abnormal EKG and the characteristics of the pain suggests pericarditis although EKG is nonspecific 2.  Headache of uncertain cause 3.  Obesity 4.  Recent admission for diverticulosis last week  Recommendations:  Await echocardiogram today, observe in the hospital today and initiate treatment with nonstorable anti-inflammatory agents as well as  colchicine.     Kerry Hough  MD [facility] Cardiology  03/03/2015, 8:33 AM

## 2015-03-03 NOTE — Progress Notes (Signed)
Utilization Review completed. Raihan Kimmel RN BSN CM 

## 2015-03-04 ENCOUNTER — Encounter (HOSPITAL_COMMUNITY): Payer: Self-pay | Admitting: Physician Assistant

## 2015-03-04 DIAGNOSIS — D649 Anemia, unspecified: Secondary | ICD-10-CM

## 2015-03-04 DIAGNOSIS — I309 Acute pericarditis, unspecified: Secondary | ICD-10-CM | POA: Diagnosis present

## 2015-03-04 LAB — CBC
HCT: 33.3 % — ABNORMAL LOW (ref 36.0–46.0)
Hemoglobin: 10.8 g/dL — ABNORMAL LOW (ref 12.0–15.0)
MCH: 28.1 pg (ref 26.0–34.0)
MCHC: 32.4 g/dL (ref 30.0–36.0)
MCV: 86.7 fL (ref 78.0–100.0)
Platelets: 213 10*3/uL (ref 150–400)
RBC: 3.84 MIL/uL — AB (ref 3.87–5.11)
RDW: 13.5 % (ref 11.5–15.5)
WBC: 4.6 10*3/uL (ref 4.0–10.5)

## 2015-03-04 MED ORDER — IBUPROFEN 400 MG PO TABS: 400.0000 mg | ORAL_TABLET | Freq: Three times a day (TID) | ORAL | Status: DC

## 2015-03-04 MED ORDER — COLCHICINE 0.6 MG PO TABS: 0.6000 mg | ORAL_TABLET | Freq: Two times a day (BID) | ORAL | Status: DC

## 2015-03-04 NOTE — Discharge Summary (Signed)
Discharge Summary   Patient ID: Jenna Mccann, MRN: [id number], DOB/AGE: 26-Oct-1960 54 y.o.  Admit date: 03/02/2015 Discharge date: 03/04/2015   Patient Care Team: Provider Not In System as PCP - General Leonie Man, MD as Consulting Physician (Cardiology)    Reason for Admission:   Chest Pain   Primary Discharge Diagnoses:  Principal Problem:   Acute pericarditis Active Problems:   Abnormal EKG   Chest pain   Morbid obesity   Normocytic anemia     Wt Readings from Last 3 Encounters:  03/04/15 295 lb 12.8 oz (134.174 kg)    Secondary Discharge Diagnoses:   Past Medical History  Diagnosis Date  . Diverticulitis   . Pericarditis 02/2015  . History of cardiac catheterization     LHC during admit for pericarditis 8/16:  normal coronary arteries  . History of echocardiogram     a. Echo 03/03/15:  mild to mod LVH, EF 55-60%, no RWMA, mildly dilated aortic root, mild LAE  . Normocytic anemia       Allergies:   No Known Allergies    Procedures Performed This Admission:    CARDIAC CATHETERIZATION 03/02/15 1. Normal coronary arteries 2. Normal left ventricular function   Normal coronary arteries  Normal left ventricular function  Prolonged continuous chest pain, thickened per cardiac CT scan, and nondiagnostic EKG raises question of Pericarditis since pulmonary embolism and coronary disease have been excluded.   Recommendations:    Further management per the primary team.     Coronary Findings    Dominance: Right   Left Anterior Descending   . Second Diagonal Branch   The vessel is moderate in size.   . Third Diagonal Branch   The vessel is small in size.     Left Circumflex   . First Obtuse Marginal Branch   The vessel is small in size.     Right Coronary Artery   . Acute Marginal Branch   The vessel is small in size.      Wall Motion                 Coronary Diagrams    Diagnostic Diagram           Hospital  Course:  Jenna Mccann is a 54 y.o. female with a recent hx of diverticulitis.  She presented to [facility] ED on 03/02/2015 with complaints of chest heaviness.  CT was negative for pulmonary embolism. There was noted slightly thickened pericardium without effusion on the CT.  Her ECG demonstrated TWI in 2, 3, V2-4.  She noted worsening symptoms with lying flat and associated orthopnea as well as nausea.  Given prolonged symptoms, cardiac catheterization was recommended.  This demonstrated normal coronary arteries.  Echocardiogram demonstrated normal LVF and no pericardial effusion.  NSAIDs + Colchicine were started.  She was evaluated by Dr. Viona Gilmore. Tollie Eth this AM and noted to have improved symptoms.  She is felt to be stable for DC to home on Colchicine 0.6 mg Twice daily and Ibuprofen 400 mg Three times a day for 1-2 weeks.  Of note, mild normocytic anemia noted during admission.  Hgb remained stable.  This can be evaluated with her PCP as an outpatient.    Discharge Vitals:   Blood pressure 92/52, pulse 67, temperature 98.4 F (36.9 C), temperature source Oral, resp. rate 18, height 5\' 8"  (1.727 m), weight 295 lb 12.8 oz (134.174 kg), SpO2 96 %.   Labs:   Recent Labs  03/02/15 2051 03/03/15 0303 03/04/15 0421  WBC 7.7 6.1 4.6  HGB 11.5* 10.8* 10.8*  HCT 34.4* 32.5* 33.3*  MCV 85.4 85.8 86.7  PLT 219 216 213     Recent Labs  03/02/15 0644 03/02/15 2051  NA 139  --   K 3.4*  --   CL 102  --   CO2 24  --   BUN 8  --   CREATININE 0.80 0.80  CALCIUM 9.0  --     Lab Results  Component Value Date   BNP 63.3 03/02/2015     Recent Labs  03/02/15 0644 03/02/15 1530 03/02/15 2051 03/03/15 0303  TROPONINI <0.03 <0.03 <0.03 <0.03     Lab Results  Component Value Date   DDIMER 0.88* 03/02/2015    Lab Results  Component Value Date   TSH 2.831 03/02/2015     Recent Labs  03/02/15 1530  INR 1.07     Diagnostic Procedures and Studies:  Dg Chest 2 View   03/02/2015     IMPRESSION: No edema or consolidation.   Electronically Signed   By: Lowella Grip III M.D.   On: 03/02/2015 07:35    Ct Angio Chest Pe W/cm &/or Wo Cm  03/02/2015    IMPRESSION: No demonstrable pulmonary embolus. Bilateral lower lobe atelectatic change, in part due to dependent positioning. No edema or consolidation. No adenopathy. Slight generalized pericardial thickening without effusion. Significance of this mild pericardial thickening is uncertain.   Electronically Signed   By: Lowella Grip III M.D.   On: 03/02/2015 09:47     2D Echocardiogram 03/03/15 - Left ventricle: The cavity size was normal. Wall thickness was increased increased in a pattern of mild to moderate LVH. Systolic function was normal. The estimated ejection fraction was in the range of 55% to 60%. Wall motion was normal; there were no regional wall motion abnormalities. - Aortic root: The aortic root was mildly dilated. - Left atrium: The atrium was mildly dilated.   Disposition:   Pt is being discharged home today in good condition.  Follow-up Plans & Appointments      Follow-up Information    Follow up with HARDING, DAVID Viona Gilmore, MD In 1 week.   Specialty:  Cardiology   Why:  The office will call to arrange a follow up in the next 1-2 weeks with Dr. Ellyn Hack or one of the PAs or NPs   Contact information:   [address] [place] [address] [phone]       Follow up with Primary Care.   Why:  Please arrange follow up with a primary care doctor for mild anemia noted on blood work this admission      Discharge Medications    Medication List    TAKE these medications        ciprofloxacin 500 MG tablet  Commonly known as:  CIPRO  Take 500 mg by mouth 2 (two) times daily. 14 day course started 02/21/15     colchicine 0.6 MG tablet  Take 1 tablet (0.6 mg total) by mouth 2 (two) times daily.     ibuprofen 400 MG tablet  Commonly known as:  ADVIL,MOTRIN  Take  1 tablet (400 mg total) by mouth 3 (three) times daily. Take for at least 1 week but no more than 2 weeks.     metroNIDAZOLE 500 MG tablet  Commonly known as:  FLAGYL  Take 500 mg by mouth every 8 (eight) hours. 14 day course started  02/21/15     polyethylene glycol packet  Commonly known as:  MIRALAX / GLYCOLAX  Take 17 g by mouth daily.         Outstanding Labs/Studies  1. None    Duration of Discharge Encounter: Greater than 30 minutes including physician and PA time.  Signed, Richardson Dopp, PA-C   03/04/2015 10:25 AM

## 2015-03-04 NOTE — Discharge Instructions (Signed)
We are treating you for Pericarditis.  This is the cause of your chest pain.  Take Ibuprofen 3 times a day with food for the next 1-2 weeks.  If pain is resolved after 1 week of treatment, you can stop the Ibuprofen.  You need to remain on Colchicine for a total of 3 months.  Studies have shown that the likelihood of this condition recurring is much less if you take Colchicine for 3 months.    Pericarditis Pericarditis is swelling (inflammation) of the pericardium. The pericardium is a thin, double-layered, fluid-filled tissue sac that surrounds the heart. The purpose of the pericardium is to contain the heart in the chest cavity and keep the heart from overexpanding. Different types of pericarditis can occur, such as:  Acute pericarditis. Inflammation can develop suddenly in acute pericarditis.  Chronic pericarditis. Inflammation develops gradually and is long-lasting in chronic pericarditis.  Constrictive pericarditis. In this type of pericarditis, the layers of the pericardium stiffen and develop scar tissue. The scar tissue thickens and sticks together. This makes it difficult for the heart to pump and work as it normally does. CAUSES  Pericarditis can be caused from different conditions, such as:  A bacterial, fungal or viral infection.  After a heart attack (myocardial infarction).  After open-heart surgery (coronary bypass graft surgery).  Auto-immune conditions such as lupus, rheumatoid arthritis or scleroderma.  Kidney failure.  Low thyroid condition (hypothyroidism).  Cancer from another part of the body that has spread (metastasized) to the pericardium.  Chest injury or trauma.  After radiation treatment.  Certain medicines. SYMPTOMS  Symptoms of pericarditis can include:  Chest pain. Chest pain symptoms may increase when laying down and may be relieved when sitting up and leaning forward.  A chronic, dry cough.  Heart palpitations. These may feel like rapid,  fluttering or pounding heart beats.  Chest pain may be worse when swallowing.  Dizziness or fainting.  Tiredness, fatigue or lethargy.  Fever. DIAGNOSIS  Pericarditis is diagnosed by the following:  A physical exam. A heart sound called a pericardial friction rub may be heard when your caregiver listens to your heart.  Blood work. Blood may be drawn to check for an infection and to look at your blood chemistry.  Electrocardiography. During electrocardiography your heart's electrical activity is monitored and recorded with a tracing on paper (electrocardiogram [ECG]).  Echocardiography.  Computed tomography (CT).  Magnetic resonance image (MRI). TREATMENT  To treat pericarditis, it is important to know the cause of it. The cause of pericarditis determines the treatment.   If the cause of pericarditis is due to an infection, treatment is based on the type of infection. If an infection is suspected in the pericardial fluid, a procedure called a pericardial fluid culture and biopsy may be done. This takes a sample of the pericardial fluid. The sample is sent to a lab which runs tests on the pericardial fluid to check for an infection.  If the autoimmune disease is the cause, treatment of the autoimmune condition will help improve the pericarditis.  If the cause of pericarditis is not known, anti-inflammatory medicines may be used to help decrease the inflammation.  Surgery may be needed. The following are types of surgeries or procedures that may be done to treat pericarditis:  Pericardial window. A pericardial window makes a cut (incision) into the pericardial sac. This allows excess fluid in the pericardium to drain.  Pericardiocentesis. A pericardiocentesis is also known as a pericardial tap. This procedure uses a needle  that is guided by X-ray to drain (aspirate) excess fluid from the pericardium.  Pericardiectomy. A pericardiectomy removes part or all of the pericardium. HOME  CARE INSTRUCTIONS   Do not smoke. If you smoke, quit. Your caregiver can help you quit smoking.  Maintain a healthy weight.  Follow an exercise program as told by your caregiver.  If you drink alcohol, do so in moderation.  Eat a heart healthy diet. A registered dietician can help you learn about healthy food choices.  Keep a list of all your medicines with you at all times. Include the name, dose, how often it is taken and how it is taken. SEEK IMMEDIATE MEDICAL CARE IF:   You have chest pain or feelings of chest pressure.  You have sweating (diaphoresis) when at rest.  You have irregular heartbeats (palpitations).  You have rapid, racing heart beats.  You have unexplained fainting episodes.  You feel sick to your stomach (nausea) or vomiting without cause.  You have unexplained weakness. If you develop any of the symptoms which originally made you seek care, call for local emergency medical help. Do not drive yourself to the hospital. Document Released: 01/07/2001 Document Revised: 10/06/2011 Document Reviewed: 07/16/2011 [facility] Patient Information 2015 [place], [place]. This information is not intended to replace advice given to you by your health care provider. Make sure you discuss any questions you have with your health care provider. Radial Site Care Refer to this sheet in the next few weeks. These instructions provide you with information on caring for yourself after your procedure. Your caregiver may also give you more specific instructions. Your treatment has been planned according to current medical practices, but problems sometimes occur. Call your caregiver if you have any problems or questions after your procedure. HOME CARE INSTRUCTIONS You may shower the day after the procedure.Remove the bandage (dressing) and gently wash the site with plain soap and water.Gently pat the site dry. Do not apply powder or lotion to the site. Do not submerge the affected site in  water for 3 to 5 days. Inspect the site at least twice daily. Do not flex or bend the affected arm for 24 hours. No lifting over 5 pounds (2.3 kg) for 5 days after your procedure. Do not drive home if you are discharged the same day of the procedure. Have someone else drive you. You may drive 24 hours after the procedure unless otherwise instructed by your caregiver. Do not operate machinery or power tools for 24 hours. A responsible adult should be with you for the first 24 hours after you arrive home. What to expect: Any bruising will usually fade within 1 to 2 weeks. Blood that collects in the tissue (hematoma) may be painful to the touch. It should usually decrease in size and tenderness within 1 to 2 weeks. SEEK IMMEDIATE MEDICAL CARE IF: You have unusual pain at the radial site. You have redness, warmth, swelling, or pain at the radial site. You have drainage (other than a small amount of blood on the dressing). You have chills. You have a fever or persistent symptoms for more than 72 hours. You have a fever and your symptoms suddenly get worse. Your arm becomes pale, cool, tingly, or numb. You have heavy bleeding from the site. Hold pressure on the site. Document Released: 08/16/2010 Document Revised: 10/06/2011 Document Reviewed: 08/16/2010 [facility] Patient Information 2015 [place], [place]. This information is not intended to replace advice given to you by your health care provider. Make sure you discuss  any questions you have with your health care provider. ° °

## 2015-03-04 NOTE — Progress Notes (Signed)
Subjective:  Feeling better today.  Headache is better.  Much less chest pain today and overall feels as if she is getting better.  Objective:  Vital Signs in the last 24 hours: BP 92/52 mmHg  Pulse 67  Temp(Src) 98.4 F (36.9 C) (Oral)  Resp 18  Ht 5\' 8"  (1.727 m)  Wt 134.174 kg (295 lb 12.8 oz)  BMI 44.99 kg/m2  SpO2 96%  Physical Exam: Obese white female currently in no acute distress but complaining of chest pain Lungs:  Clear Cardiac:  Regular rhythm, normal S1 and S2, no S3, no rub heard Extremities: Radial catheterization site clean and dry without hematoma  Intake/Output from previous day: 08/06 0701 - 08/07 0700 In: 500 [P.O.:500] Out: -   Weight Filed Weights   03/02/15 1533 03/04/15 0400  Weight: 122.471 kg (270 lb) 134.174 kg (295 lb 12.8 oz)    Lab Results: Basic Metabolic Panel:  Recent Labs  03/02/15 0644 03/02/15 2051  NA 139  --   K 3.4*  --   CL 102  --   CO2 24  --   GLUCOSE 97  --   BUN 8  --   CREATININE 0.80 0.80   CBC:  Recent Labs  03/03/15 0303 03/04/15 0421  WBC 6.1 4.6  HGB 10.8* 10.8*  HCT 32.5* 33.3*  MCV 85.8 86.7  PLT 216 213   Cardiac Enzymes: Troponin (Point of Care Test)  Recent Labs  03/02/15 0947  TROPIPOC 0.00   Cardiac Panel (last 3 results)  Recent Labs  03/02/15 1530 03/02/15 2051 03/03/15 0303  TROPONINI <0.03 <0.03 <0.03    Telemetry: Sinus rhythm  Assessment/Plan:  1.  Chest discomfort, pleuritic and positional with normal coronary arteries-she has an abnormal EKG and the characteristics of the pain suggests pericarditis although EKG is nonspecific-echocardiogram did not show a significant effusion yesterday. 2.   Obesity 3.  Headache resolved 4.  Recent admission for diverticulosis last week  Recommendations:  Okay for discharge today on colchicine 0.6 twice a day and she should take ibuprofen 400 3 times a day for a couple of weeks.  Follow-up with Dr. Ellyn Hack in one week.     Kerry Hough  MD [facility] Card[facility]  03/04/2015, 8:29 AM

## 2015-03-05 ENCOUNTER — Telehealth: Payer: Self-pay | Admitting: Cardiology

## 2015-03-05 ENCOUNTER — Encounter (HOSPITAL_COMMUNITY): Payer: Self-pay | Admitting: Interventional Cardiology

## 2015-03-05 MED FILL — Nitroglycerin IV Soln 100 MCG/ML in D5W: INTRA_ARTERIAL | Qty: 10 | Status: AC

## 2015-03-05 NOTE — Progress Notes (Signed)
Received phone call from patient stating she is unable to afford the colchicine she was discharged home on, pharmacy states medication would be $350.  Spoke with Care Manager Jenna Mccann who called cardiology office to verify if they had samples and was told the office did not have samples.  Spoke with Jenna Mccann who instructed [place] to have patient call primary cardiologist office for further instruction.  I called Jenna Mccann and gave instructions for her to call Dr. Allison Quarry office to speak with his nurse to assist with medication plan.  She stated she understood these instructions.  [name]

## 2015-03-05 NOTE — Telephone Encounter (Signed)
Spoke w/ patient. Also completed TCM call at this time.  Patient needs alternative to the colcichine she was prescribed or generic formulation. States med was too expensive at pharmacy. Not sure what total cost is.  She is uninsured w/ no Rx plan. Will route to [place] to advise.  Also had questions related to billing for upcoming office visit which would need to be deferred to billing dept.

## 2015-03-05 NOTE — Telephone Encounter (Signed)
TCM call complete.  Patient contacted regarding discharge from [facility] on 03/04/15.  Patient understands to follow up with provider Cecilie Kicks on 8/30 at 2:30p at [facility]. Patient understands discharge instructions? Yes Patient understands medications and regiment? Yes Patient understands to bring all medications to this visit? Yes  See other note for pt call regarding med question.

## 2015-03-05 NOTE — Telephone Encounter (Signed)
Patient states that she was discharged from the hospital yesterday and one of the medications she was given (does not remember the name) is too expensive.  Can we give her something else?

## 2015-03-05 NOTE — Telephone Encounter (Signed)
LMTCB

## 2015-03-05 NOTE — Telephone Encounter (Signed)
Spoke with patient, advised she try [email or web address] for patient copay card.  Cost of med is > $300/month.  Not sure how much card will help or if she will be eligible as she is uninsured.  Pt to try and contact us later if still not affordable.

## 2015-03-05 NOTE — Telephone Encounter (Signed)
D/C Phone call. Appt on 03/27/15 w/ Jenna Mccann

## 2015-03-06 LAB — HEMOGLOBIN A1C
Hgb A1c MFr Bld: 5.1 % (ref 4.8–5.6)
Mean Plasma Glucose: 100 mg/dL

## 2015-03-27 ENCOUNTER — Ambulatory Visit: Payer: Self-pay | Admitting: Cardiology

## 2016-09-18 ENCOUNTER — Other Ambulatory Visit (HOSPITAL_COMMUNITY): Payer: Self-pay | Admitting: *Deleted

## 2016-09-18 DIAGNOSIS — Z1231 Encounter for screening mammogram for malignant neoplasm of breast: Secondary | ICD-10-CM

## 2016-09-29 ENCOUNTER — Ambulatory Visit (HOSPITAL_COMMUNITY)
Admission: RE | Admit: 2016-09-29 | Discharge: 2016-09-29 | Disposition: A | Discharge status: Home / Self Care | Payer: PRIVATE HEALTH INSURANCE | Source: Ambulatory Visit | Attending: *Deleted | Admitting: *Deleted

## 2016-09-29 DIAGNOSIS — Z1231 Encounter for screening mammogram for malignant neoplasm of breast: Secondary | ICD-10-CM | POA: Diagnosis present

## 2017-11-19 ENCOUNTER — Emergency Department (HOSPITAL_COMMUNITY)
Admission: EM | Admit: 2017-11-19 | Discharge: 2017-11-20 | Disposition: A | Discharge status: Home / Self Care | Payer: Self-pay | Attending: Emergency Medicine | Admitting: Emergency Medicine

## 2017-11-19 ENCOUNTER — Emergency Department (HOSPITAL_COMMUNITY): Payer: Self-pay

## 2017-11-19 DIAGNOSIS — Z79899 Other long term (current) drug therapy: Secondary | ICD-10-CM | POA: Insufficient documentation

## 2017-11-19 DIAGNOSIS — R110 Nausea: Secondary | ICD-10-CM | POA: Insufficient documentation

## 2017-11-19 DIAGNOSIS — R0789 Other chest pain: Secondary | ICD-10-CM | POA: Insufficient documentation

## 2017-11-19 DIAGNOSIS — R079 Chest pain, unspecified: Secondary | ICD-10-CM

## 2017-11-19 DIAGNOSIS — R0602 Shortness of breath: Secondary | ICD-10-CM | POA: Insufficient documentation

## 2017-11-19 DIAGNOSIS — R1013 Epigastric pain: Secondary | ICD-10-CM | POA: Insufficient documentation

## 2017-11-19 LAB — BASIC METABOLIC PANEL
Anion gap: 7 (ref 5–15)
BUN: 15 mg/dL (ref 6–20)
CO2: 28 mmol/L (ref 22–32)
CREATININE: 0.6 mg/dL (ref 0.44–1.00)
Calcium: 8.9 mg/dL (ref 8.9–10.3)
Chloride: 105 mmol/L (ref 101–111)
GFR calc Af Amer: >60 mL/min (ref >60)
Glucose, Bld: 92 mg/dL (ref 65–99)
POTASSIUM: 3.4 mmol/L — AB (ref 3.5–5.1)
SODIUM: 140 mmol/L (ref 135–145)

## 2017-11-19 LAB — CBC
HEMATOCRIT: 31.7 % — AB (ref 36.0–46.0)
Hemoglobin: 10.7 g/dL — ABNORMAL LOW (ref 12.0–15.0)
MCH: 28.9 pg (ref 26.0–34.0)
MCHC: 33.8 g/dL (ref 30.0–36.0)
MCV: 85.7 fL (ref 78.0–100.0)
PLATELETS: 197 10*3/uL (ref 150–400)
RBC: 3.7 MIL/uL — ABNORMAL LOW (ref 3.87–5.11)
RDW: 13 % (ref 11.5–15.5)
WBC: 5.4 10*3/uL (ref 4.0–10.5)

## 2017-11-19 MED ORDER — ONDANSETRON HCL 4 MG/2ML IJ SOLN
4.0000 mg | Freq: Once | INTRAMUSCULAR | Status: AC
  Administered 2017-11-19: 4 mg via INTRAVENOUS
  Filled 2017-11-19: qty 2

## 2017-11-19 MED ORDER — GI COCKTAIL ~~LOC~~
30.0000 mL | Freq: Once | ORAL | Status: AC
  Administered 2017-11-19: 30 mL via ORAL
  Filled 2017-11-19: qty 30

## 2017-11-19 NOTE — ED Provider Notes (Signed)
[facility] EMERGENCY DEPARTMENT Provider Note   CSN: [id number] Arrival date & time: 11/19/17  2206     History   Chief Complaint Chief Complaint  Patient presents with  . Chest Pain    HPI Jenna Mccann is a 57 y.o. female who presents with chest pain. PMH significant for pericarditis. She states that the pain started all of a sudden at 4:30PM. It is constant and radiates to the back. It feels like someone is pressing their fist through her chest. She has not had pain like this before. Does not feel like when she had pericarditis. Morphine made it better. Movement and breathing makes it worse. The pain is severe in nature. She reports associated epigastric abdominal pain and nausea. She denies fever, syncope, cough, wheezing, vomiting, leg swelling. No recent surgery/travel/immobilization, hx of cancer, leg swelling, hemptysis, prior DVT/PE, or hormone use.  HPI  Past Medical History:  Diagnosis Date  . Diverticulitis   . History of cardiac catheterization    LHC during admit for pericarditis 8/16:  normal coronary arteries  . History of echocardiogram    a. Echo 03/03/15:  mild to mod LVH, EF 55-60%, no RWMA, mildly dilated aortic root, mild LAE  . Normocytic anemia   . Pericarditis 02/2015    Patient Active Problem List   Diagnosis Date Noted  . Acute pericarditis 03/04/2015  . Morbid obesity ([place]) 03/04/2015  . Normocytic anemia 03/04/2015  . Abnormal EKG 03/02/2015  . Chest pain   . Colon, diverticulosis 05/06/2014    Past Surgical History:  Procedure Laterality Date  . ABDOMINAL HYSTERECTOMY    . BACK SURGERY    . CARDIAC CATHETERIZATION N/A 03/02/2015   Procedure: Left Heart Cath and Coronary Angiography;  Surgeon: Belva Crome, MD;  Location: [place] [facility];  Service: Cardiovascular;  Laterality: N/A;  . CHOLECYSTECTOMY    . ROTATOR CUFF REPAIR Right      OB History   None      Home Medications    Prior to Admission  medications   Medication Sig Start Date End Date Taking? Authorizing Provider  ciprofloxacin (CIPRO) 500 MG tablet Take 500 mg by mouth 2 (two) times daily. 14 day course started 02/21/15    [provider]  colchicine 0.6 MG tablet Take 1 tablet (0.6 mg total) by mouth 2 (two) times daily. 03/04/15   Richardson Dopp T, PA-C  ibuprofen (ADVIL,MOTRIN) 400 MG tablet Take 1 tablet (400 mg total) by mouth 3 (three) times daily. Take for at least 1 week but no more than 2 weeks. 03/04/15   Richardson Dopp T, PA-C  metroNIDAZOLE (FLAGYL) 500 MG tablet Take 500 mg by mouth every 8 (eight) hours. 14 day course started 02/21/15    [provider]  polyethylene glycol (MIRALAX / GLYCOLAX) packet Take 17 g by mouth daily.    [provider]    Family History No family history on file.  Social History Social History   Tobacco Use  . Smoking status: Never Smoker  Substance Use Topics  . Alcohol use: No  . Drug use: Not on file     Allergies   Patient has no known allergies.   Review of Systems Review of Systems  Constitutional: Negative for chills and fever.  Respiratory: Positive for shortness of breath. Negative for cough and wheezing.   Cardiovascular: Positive for chest pain.  Gastrointestinal: Positive for abdominal pain and nausea. Negative for diarrhea and vomiting.  Neurological: Negative  for syncope and headaches.  All other systems reviewed and are negative.    Physical Exam Updated Vital Signs BP (!) 146/73   Pulse 64   SpO2 96%   Physical Exam  Constitutional: She is oriented to person, place, and time. She appears well-developed and well-nourished. She appears distressed (in pain).  HENT:  Head: Normocephalic and atraumatic.  Eyes: Pupils are equal, round, and reactive to light. Conjunctivae are normal. Right eye exhibits no discharge. Left eye exhibits no discharge. No scleral icterus.  Neck: Normal range of motion.  Cardiovascular: Normal rate and  regular rhythm.  Pulmonary/Chest: Effort normal and breath sounds normal. No respiratory distress.  Abdominal: Soft. Bowel sounds are normal. She exhibits no distension. There is tenderness (epigastric).  Neurological: She is alert and oriented to person, place, and time.  Skin: Skin is warm and dry.  Psychiatric: She has a normal mood and affect. Her behavior is normal.  Nursing note and vitals reviewed.    ED Treatments / Results  Labs (all labs ordered are listed, but only abnormal results are displayed) Labs Reviewed  CBC - Abnormal; Notable for the following components:      Result Value   RBC 3.70 (*)    Hemoglobin 10.7 (*)    HCT 31.7 (*)    All other components within normal limits  BASIC METABOLIC PANEL  I-STAT TROPONIN, ED  I-STAT BETA HCG BLOOD, ED ([facility], [facility], [facility] ONLY)    EKG None  Radiology No results found.  Procedures Procedures (including critical care time)  Medications Ordered in ED Medications  famotidine (PEPCID) IVPB 20 mg premix (has no administration in time range)  ondansetron (ZOFRAN) injection 4 mg (4 mg Intravenous Given 11/19/17 2316)  gi cocktail (Maalox,Lidocaine,Donnatal) (30 mLs Oral Given 11/19/17 2316)  morphine 4 MG/ML injection 4 mg (4 mg Intravenous Given 11/20/17 0043)     Initial Impression / Assessment and Plan / ED Course  I have reviewed the triage vital signs and the nursing notes.  Pertinent labs & imaging results that were available during my care of the patient were reviewed by me and considered in my medical decision making (see chart for details).  57 year old female with acute onset of chest pain earlier this afternoon. She is hypertensive but otherwise vitas are normal. She appears to be in significant discomfort on exam. Chest pain is not reproducible with palpation of the chest. Heart is regular rate and rhythm. Lungs are CTA. Abdomen is tender in the epigastric area. EKG is SR. CXR is negative. CBC is remarkable for  baseline anemia. BMP is remarkable for mild hypokalemia. Troponin is 0.   12:57 AM Pain is somewhat better with GI cocktail. Will add LFT and lipase as well a D-dimer with her pleuritic pain. Care transferred to [facility] PA-C who will determine dispo.  Final Clinical Impressions(s) / ED Diagnoses   Final diagnoses:  Nonspecific chest pain    ED Discharge Orders    None       Recardo Evangelist, PA-C 11/20/17 0103    Merrily Pew, MD 11/23/17 2139

## 2017-11-19 NOTE — ED Triage Notes (Addendum)
Pt arrived via [facility] c/o chest pain that began today at approx 16:30. Pt has no cardiac hx and described the pain as a stabbing pain that radiated to her back. Pt is complaining of substernal pain that radiates to the center of her back. Pt rates her pain 7/10 at triage. EMS gave 10mg  morphine, 324mg  ASA, and 1 SL nitro PTA. EMS medication brought pt pain from 10/10 to 5/10. VSS upon arrival. Pt denies any previous cardiac hx. Pt is alert and oriented x4.

## 2017-11-20 ENCOUNTER — Emergency Department (HOSPITAL_COMMUNITY): Payer: Self-pay

## 2017-11-20 DIAGNOSIS — R079 Chest pain, unspecified: Secondary | ICD-10-CM

## 2017-11-20 LAB — I-STAT TROPONIN, ED
Troponin i, poc: 0 ng/mL (ref 0.00–0.08)
Troponin i, poc: 0 ng/mL (ref 0.00–0.08)

## 2017-11-20 LAB — HEPATIC FUNCTION PANEL
ALK PHOS: 62 U/L (ref 38–126)
ALT: 52 U/L (ref 14–54)
AST: 89 U/L — AB (ref 15–41)
Albumin: 3.6 g/dL (ref 3.5–5.0)
BILIRUBIN TOTAL: 0.9 mg/dL (ref 0.3–1.2)
Bilirubin, Direct: 0.3 mg/dL (ref 0.1–0.5)
Indirect Bilirubin: 0.6 mg/dL (ref 0.3–0.9)
Total Protein: 6.7 g/dL (ref 6.5–8.1)

## 2017-11-20 LAB — LIPASE, BLOOD: Lipase: 38 U/L (ref 11–51)

## 2017-11-20 LAB — I-STAT BETA HCG BLOOD, ED (MC, WL, AP ONLY)

## 2017-11-20 LAB — D-DIMER, QUANTITATIVE: D-Dimer, Quant: 0.43 ug/mL-FEU (ref 0.00–0.50)

## 2017-11-20 MED ORDER — HYDROCODONE-ACETAMINOPHEN 5-325 MG PO TABS: 1.0000 | ORAL_TABLET | ORAL | 0 refills | Status: DC | PRN

## 2017-11-20 MED ORDER — IOPAMIDOL (ISOVUE-370) INJECTION 76%
INTRAVENOUS | Status: AC
  Filled 2017-11-20: qty 100

## 2017-11-20 MED ORDER — KETOROLAC TROMETHAMINE 30 MG/ML IJ SOLN
15.0000 mg | Freq: Once | INTRAMUSCULAR | Status: AC
  Administered 2017-11-20: 15 mg via INTRAVENOUS
  Filled 2017-11-20: qty 1

## 2017-11-20 MED ORDER — MORPHINE SULFATE (PF) 4 MG/ML IV SOLN
4.0000 mg | Freq: Once | INTRAVENOUS | Status: AC
  Administered 2017-11-20: 4 mg via INTRAVENOUS
  Filled 2017-11-20: qty 1

## 2017-11-20 MED ORDER — IOPAMIDOL (ISOVUE-370) INJECTION 76%
100.0000 mL | Freq: Once | INTRAVENOUS | Status: AC | PRN
  Administered 2017-11-20: 100 mL via INTRAVENOUS

## 2017-11-20 MED ORDER — FAMOTIDINE IN NACL 20-0.9 MG/50ML-% IV SOLN
20.0000 mg | Freq: Once | INTRAVENOUS | Status: AC
  Administered 2017-11-20: 20 mg via INTRAVENOUS
  Filled 2017-11-20: qty 50

## 2017-11-20 MED ORDER — FAMOTIDINE 40 MG PO TABS: 40.0000 mg | ORAL_TABLET | Freq: Every day | ORAL | 0 refills | Status: DC

## 2017-11-20 NOTE — ED Notes (Signed)
ED Provider at bedside. 

## 2017-11-20 NOTE — Discharge Instructions (Signed)
Your blood work, EKG, CT scan of your chest and ultrasound of your abdomen were reassuring today.  Follow-up with the health department for further monitoring.  You can start taking Pepcid daily to help with your symptoms.  I have also written you a prescription for Norco which you can take for breakthrough pain.  This medicine can make you drowsy so please do not drive or work while taking it.

## 2017-11-20 NOTE — ED Notes (Signed)
Patient ambulatory to bathroom with steady gait at this time 

## 2017-11-20 NOTE — ED Notes (Signed)
Pt back in room from CT 

## 2017-11-20 NOTE — Consult Note (Signed)
Cardiology Consultation:   Patient ID: Jenna Mccann; [id number]; [birth year]   Admit date: 11/19/2017 Date of Consult: 11/20/2017  Primary Care Provider: Raiford Simmonds., PA-C Primary Cardiologist: harding   Primary   Patient Profile:   Jenna Mccann is a 57 y.o. female with a hx of CP  who is being seen today for the evaluation of  CP at the request of Dr Leafy Half  History of Present Illness:   Jenna Mccann si a 57 yo with history of CP in past  In 2016 she had L heart cath which showed normal coronary arteries  Normal LV function  CT with slight pericardial thickening    Pain resolved  Pt described as a stabbing sensation at the time  Pt says she was doing good until last weekend   Had GI bug   Numerous episodes of N/V, dry heaves   Resolved on Saturday.   Did OK earlier this week    Yesterday she developed a boring sensation in chest that went to back    Not pleruitic or positional  Denies reflux  Came to ED  No F/C [place].    CT of chest showed   Aorta was normal with only scattered calcifications   There were scattered calcifications of coronary arteries  Blood work showed  Neg trop x 2  T Dimer normal    The pt continues to have boring sensation  NOt as intense   Past Medical History:  Diagnosis Date  . Diverticulitis   . History of cardiac catheterization    LHC during admit for pericarditis 8/16:  normal coronary arteries  . History of echocardiogram    a. Echo 03/03/15:  mild to mod LVH, EF 55-60%, no RWMA, mildly dilated aortic root, mild LAE  . Normocytic anemia   . Pericarditis 02/2015    Past Surgical History:  Procedure Laterality Date  . ABDOMINAL HYSTERECTOMY    . BACK SURGERY    . CARDIAC CATHETERIZATION N/A 03/02/2015   Procedure: Left Heart Cath and Coronary Angiography;  Surgeon: Belva Crome, MD;  Location: [place] [facility];  Service: Cardiovascular;  Laterality: N/A;  . CHOLECYSTECTOMY    . ROTATOR CUFF REPAIR Right        Inpatient  Medications: Scheduled Meds: . iopamidol       Continuous Infusions:  PRN Meds:   Allergies:   No Known Allergies  Social History:   Social History   Socioeconomic History  . Marital status: Married    Spouse name: Not on file  . Number of children: Not on file  . Years of education: Not on file  . Highest education level: Not on file  Occupational History  . Not on file  Social Needs  . Financial resource strain: Not on file  . Food insecurity:    Worry: Not on file    Inability: Not on file  . Transportation needs:    Medical: Not on file    Non-medical: Not on file  Tobacco Use  . Smoking status: Never Smoker  Substance and Sexual Activity  . Alcohol use: No  . Drug use: Not on file  . Sexual activity: Not on file  Lifestyle  . Physical activity:    Days per week: Not on file    Minutes per session: Not on file  . Stress: Not on file  Relationships  . Social connections:    Talks on phone: Not on file    Gets together: Not  on file    Attends religious service: Not on file    Active member of club or organization: Not on file    Attends meetings of clubs or organizations: Not on file    Relationship status: Not on file  . Intimate partner violence:    Fear of current or ex partner: Not on file    Emotionally abused: Not on file    Physically abused: Not on file    Forced sexual activity: Not on file  Other Topics Concern  . Not on file  Social History Narrative  . Not on file    Family History:   No FHx of early CAD   ROS:  Please see the history of present illness.  All other ROS reviewed and negative.     Physical Exam/Data:   Vitals:   11/20/17 0700 11/20/17 0800 11/20/17 0900 11/20/17 1000  BP: 130/75 115/69 120/66 126/69  Pulse: (!) 56 60 (!) 55 (!) 52  Resp: 16 19 17 15   SpO2: 100% 94% 97% 96%    Intake/Output Summary (Last 24 hours) at 11/20/2017 1102 Last data filed at 11/20/2017 0233 Gross per 24 hour  Intake 50 ml  Output -    Net 50 ml   There were no vitals filed for this visit. There is no height or weight on file to calculate BMI.  General:  Well nourished, well developed, in no acute distress HEENT: normal Lymph: no adenopathy Neck: no JVD Endocrine:  No thryomegaly Vascular: No carotid bruits; 2+ DP   Cardiac:  normal S1, S2; RRR; no murmur No rub   Lungs:  clear to auscultation bilaterally, no wheezing, rhonchi or rales  Chest   Tender to palpation  Brings on pain that she has had  Abd: soft, nontender, no hepatomegaly  Ext: no edema Musculoskeletal:  No deformities, BUE and BLE strength normal and equal Skin: warm and dry  Neuro:  CNs 2-12 intact, no focal abnormalities noted Psych:  Normal affect   EKG:  The EKG was personally reviewed and demonstrates:   SR  T wave inversion V1 to V4  Old  Telemetry:  Telemetry was personally reviewed and demonstrates:  SR    Relevant CV Studies:   Laboratory Data:  Chemistry Recent Labs  Lab 11/19/17 2235  NA 140  K 3.4*  CL 105  CO2 28  GLUCOSE 92  BUN 15  CREATININE 0.60  CALCIUM 8.9  GFRNONAA >60  GFRAA >60  ANIONGAP 7    Recent Labs  Lab 11/20/17 0034  PROT 6.7  ALBUMIN 3.6  AST 89*  ALT 52  ALKPHOS 62  BILITOT 0.9   Hematology Recent Labs  Lab 11/19/17 2235  WBC 5.4  RBC 3.70*  HGB 10.7*  HCT 31.7*  MCV 85.7  MCH 28.9  MCHC 33.8  RDW 13.0  PLT 197   Cardiac EnzymesNo results for input(s): TROPONINI in the last 168 hours.  Recent Labs  Lab 11/19/17 2356 11/20/17 0605  TROPIPOC 0.00 0.00    BNPNo results for input(s): BNP, PROBNP in the last 168 hours.  DDimer  Recent Labs  Lab 11/20/17 0034  DDIMER 0.43    Radiology/Studies:  Dg Chest 2 View  Result Date: 11/19/2017 CLINICAL DATA:  Chest pain EXAM: CHEST - 2 VIEW COMPARISON:  03/02/2015, CT chest 03/02/2015 FINDINGS: No acute pulmonary infiltrate or effusion. Normal cardiomediastinal silhouette. No pneumothorax. Surgical clips in the right upper  quadrant. IMPRESSION: No active cardiopulmonary disease. Electronically Signed  By: Donavan Foil M.D.   On: 11/19/2017 23:48   US Abdomen Complete  Result Date: 11/20/2017 CLINICAL DATA:  Epigastric pain. EXAM: ABDOMEN ULTRASOUND COMPLETE COMPARISON:  CT abdomen and pelvis 02/20/2015 FINDINGS: Gallbladder: Gallbladder is surgically absent. No collection identified in the gallbladder fossa. Common bile duct: Diameter: 7.7 mm, normal for postcholecystectomy patient. Liver: No focal lesion identified. Within normal limits in parenchymal echogenicity. Portal vein is patent on color Doppler imaging with normal direction of blood flow towards the liver. IVC: No abnormality visualized. Pancreas: Pancreas is not visualized due to overlying bowel gas. Spleen: Size and appearance within normal limits. Right Kidney: Length: 10.1 cm. Echogenicity within normal limits. No mass or hydronephrosis visualized. Left Kidney: Length: 11.1 cm. Echogenicity within normal limits. No mass or hydronephrosis visualized. Abdominal aorta: No aneurysm visualized. Other findings: None. IMPRESSION: Surgical absence of the gallbladder. No significant bile duct dilatation. No acute process identified. Electronically Signed   By: Lucienne Capers M.D.   On: 11/20/2017 03:30   Ct Angio Chest/abd/pel For Dissection W And/or W/wo  Result Date: 11/20/2017 CLINICAL DATA:  Stabbing substernal chest pain radiating to back. History of pericarditis, hysterectomy, cholecystectomy. EXAM: CT ANGIOGRAPHY CHEST, ABDOMEN AND PELVIS TECHNIQUE: Multidetector CT imaging through the chest, abdomen and pelvis was performed using the standard protocol during bolus administration of intravenous contrast. Multiplanar reconstructed images and MIPs were obtained and reviewed to evaluate the vascular anatomy. CONTRAST:  148mL ISOVUE-370 IOPAMIDOL (ISOVUE-370) INJECTION 76% COMPARISON:  CT chest March 02, 2015 and CT abdomen and pelvis February 20, 2015 FINDINGS: CTA  CHEST FINDINGS CARDIOVASCULAR: Thoracic aorta is normal course and caliber. No intrinsic density on noncontrast CT. Trace calcific atherosclerosis. Trace coronary artery calcifications. LEFT vertebral artery arises directly from the aortic arch. Homogeneous contrast opacification of thoracic aorta without dissection, aneurysm, luminal irregularity, periaortic fluid collections, or contrast extravasation. Heart size is normal. No pericardial effusion or thickening. No pulmonary embolism to the level of the segmental branches. MEDIASTINUM/NODES: No mediastinal mass or lymphadenopathy by CT size criteria. LUNGS/PLEURA: Tracheobronchial tree is patent, no pneumothorax. No pleural effusions, focal consolidations, pulmonary nodules or masses. Minimal dependent atelectasis. MUSCULOSKELETAL: Non-suspicious. Review of the MIP images confirms the above findings. CTA ABDOMEN AND PELVIS FINDINGS VASCULAR Aorta: Abdominal aorta is normal course and caliber. Trace calcific atherosclerosis homogeneous contrast opacification of aortoiliac vessels without dissection, aneurysm, luminal irregularity, periaortic fluid collections, or contrast extravasation. Celiac: Patent. SMA: Patent. Renals: Patent. IMA: Patent. Inflow: Negative. Veins: Negative, not tailored for evaluation. Review of the MIP images confirms the above findings. NON-VASCULAR HEPATOBILIARY: Status post cholecystectomy. PANCREAS: Normal. SPLEEN: Normal. ADRENALS/URINARY TRACT: Kidneys are orthotopic, demonstrating symmetric enhancement. No nephrolithiasis, hydronephrosis or solid renal masses. The unopacified ureters are normal in course and caliber. Urinary bladder is partially distended and unremarkable. Normal adrenal glands. STOMACH/BOWEL: The stomach, small and large bowel are normal in course and caliber without inflammatory changes. Moderate descending and sigmoid colonic diverticulosis. Normal appendix. VASCULAR/LYMPHATIC: No lymphadenopathy by CT size criteria.  REPRODUCTIVE: Status post hysterectomy. OTHER: No intraperitoneal free fluid or free air. MUSCULOSKELETAL: Nonacute. Mild LEFT sacroiliac osteoarthrosis. Severe L5-S1 degenerative disc, mild remaining lumbar spondylosis. Review of the MIP images confirms the above findings. IMPRESSION: CTA CHEST: 1. No acute vascular process or acute cardiopulmonary disease. CTA ABDOMEN AND PELVIS: 1. No acute vascular process or acute intra-abdominal/pelvic disease. Electronically Signed   By: Elon Alas M.D.   On: 11/20/2017 05:38    Assessment and Plan:   Pt is a 57 yo with  history of CP in 2016    Cath normal   Presents with about 16 hours of CP now that is diffent en quality   Describes ad pressure, boring sensation through chest  ON exam:  Pain is intensified with pressing on sternum  EKG without acute change   Trop neg   CT with scattered coronary and aortic calcifications  I do not think that pts symptoms represent angina Probably musculoskeletal  May be related to GI illness with vomiting / wretching last weekend  I think it is OK to D/C pt  Rx NSAID (with GI prophylaxis) and Tramadol and rest F/U with preimary MD  2   Coronary calcifcations   Pt should be on a statin to prevent progression   WIll defer to primary MD.        For questions or updates, please contact [facility] Please consult [email or web address] for contact info under Cardiology/STEMI.   Signed, Dorris Carnes, MD  11/20/2017 11:02 AM

## 2017-11-20 NOTE — ED Provider Notes (Signed)
Care assumed from previous provider PA G[place]as . Please see their note for further details to include full history and physical. To summarize in short pt is a 57 year old female presents to the ED for chest pain.. Case discussed, plan agreed upon.   At time of care handoff was awaiting further lab work to assess for any GI pathology.  D-dimer was also ordered.  On with repeat delta troponin.  D-dimer returned that was normal.  Normal LFTs and lipase.  Delta troponin was negative.  Repeat EKG shows no further ischemic changes and appears prior tracing.  I did discuss with patient and she is continued to have 4 out of 5 chest pain that is substernal does not radiate.  Patient describes it as pressure.  I discussed my attending Dr. Mariane Baumgarten who recommends that patient would benefit from a dissection study however this is low on the differential given ongoing chest pain.  Dissection study was performed that was unremarkable.  Presentation does not seem consistent with pericarditis.  His heart score is 3.  I have low suspicion for ACS however given patient's ongoing chest pain I feel that she would benefit from formal cardiology consult.  I did review patient's prior cardiac catheterization 3 years ago that was unremarkable.  I spoke with Dr. Harrington Challenger with cardiology given patient's ongoing substernal chest pain.  She recommends patient have a possible rest Myoview.  She will also see patient in the ED for official consultation and recommendations.  I spoke with nuclear medicine who states that this since patient has not been n.p.o. since midnight that it will be up to the recommendations of cardiology if she can proceed with the rest Myoview.  Patient was updated on plan of care.  She states that she needs more pain medicine.  Again unknown etiology of patient's symptoms.  Does not seem GI in nature given normal work-up, normal CT scan and patient symptoms did not improve with Pepcid, Zofran.  Care  handoff to PA [place]. Pt has pending at this time cardiology consult.  Disposition likely home pending lab and test results. Care dicussed and plan agreed upon with oncoming PA. Pt updated on plan of care and is currently hemodynamically stable at this time with normal vs.  Dr. Rogene Houston is also aware of plan of care.   Patient remains hemodynamically stable at this time.        Doristine Devoid, PA-C 11/20/17 0914    Fredia Sorrow, MD 11/20/17 1000    Fredia Sorrow, MD 11/20/17 1005

## 2017-11-20 NOTE — ED Provider Notes (Signed)
Received signout at shift change from PA [facility]. Please see his note for further details. Plan to disposition patient once Cardiologist Dr. Harrington Challenger sees the patient.   Spoke with Cardiologist Dr. Harrington Challenger who does not believe patient's symptoms represent angina. More likely musculoskeletal or related to recent GI illness with vomiting. She suggests d/c with pain management and follow up with PCP.  Patient informed and discharged accordingly.   Vitals:   11/20/17 0900 11/20/17 1000  BP: 120/66 126/69  Pulse: (!) 55 (!) 52  Resp: 17 15  SpO2: 97% 96%      Glyn Ade, PA-C 11/20/17 1648    Fredia Sorrow, MD 11/22/17 [phone]

## 2017-11-20 NOTE — ED Notes (Signed)
Patient transported to Ultrasound 

## 2017-11-20 NOTE — ED Notes (Signed)
Cardiology at bedside.

## 2017-11-20 NOTE — ED Notes (Signed)
Patient transported to CT 

## 2019-08-02 ENCOUNTER — Other Ambulatory Visit: Payer: Self-pay

## 2019-08-02 ENCOUNTER — Emergency Department (HOSPITAL_COMMUNITY)
Admission: EM | Admit: 2019-08-02 | Discharge: 2019-08-03 | Disposition: A | Discharge status: Home / Self Care | Payer: Self-pay | Attending: Emergency Medicine | Admitting: Emergency Medicine

## 2019-08-02 DIAGNOSIS — R519 Headache, unspecified: Secondary | ICD-10-CM | POA: Insufficient documentation

## 2019-08-02 DIAGNOSIS — Z79899 Other long term (current) drug therapy: Secondary | ICD-10-CM | POA: Insufficient documentation

## 2019-08-02 LAB — BASIC METABOLIC PANEL
Anion gap: 9 (ref 5–15)
BUN: 20 mg/dL (ref 6–20)
CO2: 26 mmol/L (ref 22–32)
Calcium: 9.4 mg/dL (ref 8.9–10.3)
Chloride: 101 mmol/L (ref 98–111)
Creatinine, Ser: 0.81 mg/dL (ref 0.44–1.00)
GFR calc Af Amer: >60 mL/min (ref >60)
GFR calc non Af Amer: >60 mL/min (ref >60)
Glucose, Bld: 97 mg/dL (ref 70–99)
Potassium: 4 mmol/L (ref 3.5–5.1)
Sodium: 136 mmol/L (ref 135–145)

## 2019-08-02 LAB — CBC
HCT: 39.6 % (ref 36.0–46.0)
Hemoglobin: 12.7 g/dL (ref 12.0–15.0)
MCH: 29.1 pg (ref 26.0–34.0)
MCHC: 32.1 g/dL (ref 30.0–36.0)
MCV: 90.6 fL (ref 80.0–100.0)
Platelets: 258 10*3/uL (ref 150–400)
RBC: 4.37 MIL/uL (ref 3.87–5.11)
RDW: 12.9 % (ref 11.5–15.5)
WBC: 6.5 10*3/uL (ref 4.0–10.5)
nRBC: 0 % (ref 0.0–0.2)

## 2019-08-02 NOTE — ED Triage Notes (Signed)
Pt c/o headache that started Sunday morning states it woke her up around 5am. Went to bed around 9pm Saturday with no headache. Said that the pain is behind her left ear. It never moves. Laying down makes it worse. Denies any numbness or tingling .

## 2019-08-03 ENCOUNTER — Emergency Department (HOSPITAL_COMMUNITY): Payer: Self-pay

## 2019-08-03 MED ORDER — DEXAMETHASONE SODIUM PHOSPHATE 10 MG/ML IJ SOLN
10.0000 mg | Freq: Once | INTRAMUSCULAR | Status: AC
  Administered 2019-08-03: 10 mg via INTRAVENOUS
  Filled 2019-08-03: qty 1

## 2019-08-03 MED ORDER — PROMETHAZINE HCL 25 MG/ML IJ SOLN
12.5000 mg | Freq: Once | INTRAMUSCULAR | Status: AC
  Administered 2019-08-03: 12.5 mg via INTRAVENOUS
  Filled 2019-08-03: qty 1

## 2019-08-03 MED ORDER — KETOROLAC TROMETHAMINE 30 MG/ML IJ SOLN
30.0000 mg | Freq: Once | INTRAMUSCULAR | Status: AC
  Administered 2019-08-03: 30 mg via INTRAVENOUS
  Filled 2019-08-03: qty 1

## 2019-08-03 MED ORDER — MORPHINE SULFATE (PF) 4 MG/ML IV SOLN
4.0000 mg | Freq: Once | INTRAVENOUS | Status: AC
  Administered 2019-08-03: 4 mg via INTRAVENOUS
  Filled 2019-08-03: qty 1

## 2019-08-03 MED ORDER — TRAMADOL HCL 50 MG PO TABS: 50.0000 mg | ORAL_TABLET | Freq: Four times a day (QID) | ORAL | 0 refills | Status: DC | PRN

## 2019-08-03 MED ORDER — SODIUM CHLORIDE 0.9 % IV BOLUS
1000.0000 mL | Freq: Once | INTRAVENOUS | Status: AC
  Administered 2019-08-03: 1000 mL via INTRAVENOUS

## 2019-08-03 NOTE — ED Provider Notes (Signed)
[facility] EMERGENCY DEPARTMENT Provider Note   CSN: [id number] Arrival date & time: 08/02/19  1943     History Chief Complaint  Patient presents with  . Headache    Jenna Mccann is a 59 y.o. female.  Patient is a 59 year old female with past medical history of anemia, neuropathy, and diverticulitis.  She presents today for evaluation of head pain.  Patient states she woke from sleep 2 mornings ago with pain to the left side of her head just above her left ear.  This has been coming and going for the past 2 days, but became considerably worse this evening.  The pain is now constant.  She denies any visual disturbances.  She denies any nausea.  She denies any weakness or numbness.  She denies any history of migraines.  She denies any recent injury or trauma.  She has been taking ibuprofen with little relief.  The history is provided by the patient.  Headache Pain location:  L parietal Quality:  Sharp Radiates to:  Does not radiate Duration:  2 days Timing:  Constant Progression:  Worsening Chronicity:  New Similar to prior headaches: no   Worsened by:  Nothing      Past Medical History:  Diagnosis Date  . Diverticulitis   . History of cardiac catheterization    LHC during admit for pericarditis 8/16:  normal coronary arteries  . History of echocardiogram    a. Echo 03/03/15:  mild to mod LVH, EF 55-60%, no RWMA, mildly dilated aortic root, mild LAE  . Normocytic anemia   . Pericarditis 02/2015    Patient Active Problem List   Diagnosis Date Noted  . Acute pericarditis 03/04/2015  . Morbid obesity ([place]) 03/04/2015  . Normocytic anemia 03/04/2015  . Abnormal EKG 03/02/2015  . Chest pain   . Colon, diverticulosis 05/06/2014    Past Surgical History:  Procedure Laterality Date  . ABDOMINAL HYSTERECTOMY    . BACK SURGERY    . CARDIAC CATHETERIZATION N/A 03/02/2015   Procedure: Left Heart Cath and Coronary Angiography;  Surgeon: Belva Crome, MD;  Location: [place] [facility];  Service: Cardiovascular;  Laterality: N/A;  . CHOLECYSTECTOMY    . ROTATOR CUFF REPAIR Right      OB History   No obstetric history on file.     No family history on file.  Social History   Tobacco Use  . Smoking status: Never Smoker  Substance Use Topics  . Alcohol use: No  . Drug use: Not on file    Home Medications Prior to Admission medications   Medication Sig Start Date End Date Taking? Authorizing Provider  colchicine 0.6 MG tablet Take 1 tablet (0.6 mg total) by mouth 2 (two) times daily. Patient taking differently: Take 0.6 mg by mouth 2 (two) times daily as needed (Gout pain).  03/04/15   Richardson Dopp T, PA-C  famotidine (PEPCID) 40 MG tablet Take 1 tablet (40 mg total) by mouth daily. 11/20/17   Glyn Ade, PA-C  HYDROcodone-acetaminophen (NORCO/VICODIN) 5-325 MG tablet Take 1 tablet by mouth every 4 (four) hours as needed. 11/20/17   Glyn Ade, PA-C  polyethylene glycol (MIRALAX / GLYCOLAX) packet Take 17 g by mouth daily as needed for mild constipation.     [provider]    Allergies    Patient has no known allergies.  Review of Systems   Review of Systems  Neurological: Positive for headaches.  All other systems reviewed and are  negative.   Physical Exam Updated Vital Signs BP (!) 173/109 (BP Location: Right Arm)   Pulse 72   Temp 97.7 F (36.5 C) (Oral)   Ht 5\' 7"  (1.702 m)   Wt 120.2 kg   SpO2 99%   BMI 41.50 kg/m   Physical Exam Vitals and nursing note reviewed.  Constitutional:      General: She is not in acute distress.    Appearance: She is well-developed. She is not diaphoretic.  HENT:     Head: Normocephalic and atraumatic.  Eyes:     General: No visual field deficit.    Extraocular Movements: Extraocular movements intact.     Pupils: Pupils are equal, round, and reactive to light.  Cardiovascular:     Rate and Rhythm: Normal rate and regular rhythm.     Heart sounds: No murmur. No  friction rub. No gallop.   Pulmonary:     Effort: Pulmonary effort is normal. No respiratory distress.     Breath sounds: Normal breath sounds. No wheezing.  Abdominal:     General: Bowel sounds are normal. There is no distension.     Palpations: Abdomen is soft.     Tenderness: There is no abdominal tenderness.  Musculoskeletal:        General: Normal range of motion.     Cervical back: Normal range of motion and neck supple.  Skin:    General: Skin is warm and dry.  Neurological:     Mental Status: She is alert and oriented to person, place, and time.     Cranial Nerves: No cranial nerve deficit, dysarthria or facial asymmetry.     ED Results / Procedures / Treatments   Labs (all labs ordered are listed, but only abnormal results are displayed) Labs Reviewed  BASIC METABOLIC PANEL  CBC  URINALYSIS, ROUTINE W REFLEX MICROSCOPIC    EKG None  Radiology No results found.  Procedures Procedures (including critical care time)  Medications Ordered in ED Medications  morphine 4 MG/ML injection 4 mg (has no administration in time range)  promethazine (PHENERGAN) injection 12.5 mg (has no administration in time range)  sodium chloride 0.9 % bolus 1,000 mL (has no administration in time range)    ED Course  I have reviewed the triage vital signs and the nursing notes.  Pertinent labs & imaging results that were available during my care of the patient were reviewed by me and considered in my medical decision making (see chart for details).    MDM Rules/Calculators/A&P  Patient presents here with complaints of pain to the left side of her head just above her left ear.  This has been occurring intermittently for the past 2 days, then became constant this evening.  Her neurologic exam is nonfocal and physical examination reveals no obvious abnormality.  Her head CT is negative.  Patient given medication for pain/headache.  She is feeling somewhat better after receiving these  medications.  Nothing today appears emergent and I feel as though patient is appropriate for discharge.  She will be given tramadol she can take as needed and is to follow-up with primary doctor if not improving.  Final Clinical Impression(s) / ED Diagnoses Final diagnoses:  None    Rx / DC Orders ED Discharge Orders    None       Veryl Speak, MD 08/03/19 0221

## 2019-08-03 NOTE — Discharge Instructions (Addendum)
Take ibuprofen 600 mg every 6 hours as needed for pain.  Begin taking tramadol as prescribed as needed for pain not relieved with ibuprofen.  Follow-up with your primary doctor if not improving in the next few days, and return to the ER if symptoms significantly worsen or change.

## 2020-02-23 ENCOUNTER — Other Ambulatory Visit (HOSPITAL_COMMUNITY): Payer: Self-pay | Admitting: Physician Assistant

## 2020-02-23 ENCOUNTER — Ambulatory Visit (HOSPITAL_COMMUNITY)
Admission: RE | Admit: 2020-02-23 | Discharge: 2020-02-23 | Disposition: A | Discharge status: Home / Self Care | Payer: Self-pay | Source: Ambulatory Visit | Attending: Physician Assistant | Admitting: Physician Assistant

## 2020-02-23 ENCOUNTER — Other Ambulatory Visit: Payer: Self-pay

## 2020-02-23 DIAGNOSIS — M25562 Pain in left knee: Secondary | ICD-10-CM

## 2020-03-13 ENCOUNTER — Other Ambulatory Visit: Payer: Self-pay

## 2020-03-13 ENCOUNTER — Encounter: Payer: Self-pay | Admitting: Orthopaedic Surgery

## 2020-03-13 ENCOUNTER — Telehealth: Payer: Self-pay | Admitting: Radiology

## 2020-03-13 ENCOUNTER — Ambulatory Visit (INDEPENDENT_AMBULATORY_CARE_PROVIDER_SITE_OTHER): Payer: Self-pay | Admitting: Orthopaedic Surgery

## 2020-03-13 VITALS — BP 204/133 | HR 86 | Ht 68.0 in | Wt 304.0 lb

## 2020-03-13 DIAGNOSIS — Z6841 Body Mass Index (BMI) 40.0 and over, adult: Secondary | ICD-10-CM

## 2020-03-13 DIAGNOSIS — M25562 Pain in left knee: Secondary | ICD-10-CM

## 2020-03-13 DIAGNOSIS — G8929 Other chronic pain: Secondary | ICD-10-CM

## 2020-03-13 MED ORDER — NAPROXEN 500 MG PO TABS: 500.0000 mg | ORAL_TABLET | Freq: Two times a day (BID) | ORAL | 5 refills | Status: DC

## 2020-03-13 NOTE — Telephone Encounter (Signed)
I called patient to give him the number to call and make appointment for the MRI. The referral was put in for external but with his [facility] discount he has to go for a [facility] facility or he will have a bill over $2000. I left message for him to call and schedule with central scheduling

## 2020-03-13 NOTE — Progress Notes (Signed)
Subjective:    Patient ID: Jenna Mccann, female    DOB: 07-18-1961, 59 y.o.   MRN: [id number]  HPI She has long history of left knee pain.  It is getting worse over the last month.  She has giving way now and has fallen three times in the last two weeks because the knee gave way.  She has no trauma, no redness.  She has swelling and popping.  She has seen the health department and had x-rays done.  I have reviewed the notes from the health department.  She has tricompartmental degenerative joint disease.  I have independently reviewed and interpreted x-rays of this patient done at another site by another physician or qualified health professional.  She is not taking any NSAIDs.   Review of Systems  Constitutional: Positive for activity change.  Musculoskeletal: Positive for arthralgias, gait problem and joint swelling.  All other systems reviewed and are negative.  For Review of Systems, all other systems reviewed and are negative.  The following is a summary of the past history medically, past history surgically, known current medicines, social history and family history.  This information is gathered electronically by the computer from prior information and documentation.  I review this each visit and have found including this information at this point in the chart is beneficial and informative.   Past Medical History:  Diagnosis Date   Diverticulitis    History of cardiac catheterization    LHC during admit for pericarditis 8/16:  normal coronary arteries   History of echocardiogram    a. Echo 03/03/15:  mild to mod LVH, EF 55-60%, no RWMA, mildly dilated aortic root, mild LAE   Normocytic anemia    Pericarditis 02/2015    Past Surgical History:  Procedure Laterality Date   ABDOMINAL HYSTERECTOMY     BACK SURGERY     CARDIAC CATHETERIZATION N/A 03/02/2015   Procedure: Left Heart Cath and Coronary Angiography;  Surgeon: Belva Crome, MD;  Location: [place] [facility];  Service: Cardiovascular;  Laterality: N/A;   CHOLECYSTECTOMY     ROTATOR CUFF REPAIR Right     Current Outpatient Medications on File Prior to Visit  Medication Sig Dispense Refill   gabapentin (NEURONTIN) 300 MG capsule Take by mouth.     ibuprofen (ADVIL) 800 MG tablet Take 800 mg by mouth 3 (three) times daily.     polyethylene glycol (MIRALAX / GLYCOLAX) packet Take 17 g by mouth daily as needed for mild constipation.      No current facility-administered medications on file prior to visit.    Social History   Socioeconomic History   Marital status: Married    Spouse name: Not on file   Number of children: Not on file   Years of education: Not on file   Highest education level: Not on file  Occupational History   Not on file  Tobacco Use   Smoking status: Never Smoker   Smokeless tobacco: Never Used  Substance and Sexual Activity   Alcohol use: No   Drug use: Never   Sexual activity: Not Currently  Other Topics Concern   Not on file  Social History Narrative   Not on file   Social Determinants of Health   Financial Resource Strain:    Difficulty of Paying Living Expenses:   Food Insecurity:    Worried About [place] in the Last Year:    [place] in the Last Year:  Transportation Needs:    Film/video editor (Medical):    Lack of Transportation (Non-Medical):   Physical Activity:    Days of Exercise per Week:    Minutes of Exercise per Session:   Stress:    Feeling of Stress :   Social Connections:    Frequency of Communication with Friends and Family:    Frequency of Social Gatherings with Friends and Family:    Attends Religious Services:    Active Member of Clubs or Organizations:    Attends Music therapist:    Marital Status:   Intimate Partner Violence:    Fear of Current or Ex-Partner:    Emotionally Abused:    Physically Abused:    Sexually Abused:     History  reviewed. No pertinent family history.  BP (!) 204/133    Pulse 86    Ht 5\' 8"  (1.727 m)    Wt (!) 304 lb (137.9 kg)    BMI 46.22 kg/m   Body mass index is 46.22 kg/m.      Objective:   Physical Exam Vitals and nursing note reviewed.  Constitutional:      Appearance: She is well-developed.  HENT:     Head: Normocephalic and atraumatic.  Eyes:     Conjunctiva/sclera: Conjunctivae normal.     Pupils: Pupils are equal, round, and reactive to light.  Cardiovascular:     Rate and Rhythm: Normal rate and regular rhythm.  Pulmonary:     Effort: Pulmonary effort is normal.  Abdominal:     Palpations: Abdomen is soft.  Musculoskeletal:     Cervical back: Normal range of motion and neck supple.       Legs:  Skin:    General: Skin is warm and dry.  Neurological:     Mental Status: She is alert and oriented to person, place, and time.     Cranial Nerves: No cranial nerve deficit.     Motor: No abnormal muscle tone.     Coordination: Coordination normal.     Deep Tendon Reflexes: Reflexes are normal and symmetric. Reflexes normal.  Psychiatric:        Behavior: Behavior normal.        Thought Content: Thought content normal.        Judgment: Judgment normal.           Assessment & Plan:   Encounter Diagnoses  Name Primary?   Chronic pain of left knee Yes   Body mass index 45.0-49.9, adult (HCC)    Morbid obesity ([place])    I am concerned about tear of medial meniscus as well as the DJD of the left knee.  I will get MRI of the left knee.  PROCEDURE NOTE:  The patient requests injections of the left knee , verbal consent was obtained.  The left knee was prepped appropriately after time out was performed.   Sterile technique was observed and injection of 1 cc of Depo-Medrol 40 mg with several cc's of plain xylocaine. Anesthesia was provided by ethyl chloride and a 20-gauge needle was used to inject the knee area. The injection was tolerated well.  A band aid dressing  was applied.  The patient was advised to apply ice later today and tomorrow to the injection sight as needed.  I will begin Naprosyn 500 po bid pc  Return in two weeks.  Call if any problem.  Precautions discussed.   Electronically Signed Sanjuana Kava, MD 8/17/20218:21 AM

## 2020-03-21 ENCOUNTER — Other Ambulatory Visit: Payer: Self-pay

## 2020-03-21 ENCOUNTER — Ambulatory Visit (HOSPITAL_COMMUNITY)
Admission: RE | Admit: 2020-03-21 | Discharge: 2020-03-21 | Disposition: A | Discharge status: Home / Self Care | Payer: Self-pay | Source: Ambulatory Visit | Attending: Orthopaedic Surgery | Admitting: Orthopaedic Surgery

## 2020-03-21 DIAGNOSIS — M25562 Pain in left knee: Secondary | ICD-10-CM | POA: Insufficient documentation

## 2020-03-21 DIAGNOSIS — G8929 Other chronic pain: Secondary | ICD-10-CM | POA: Insufficient documentation

## 2020-03-27 ENCOUNTER — Ambulatory Visit (INDEPENDENT_AMBULATORY_CARE_PROVIDER_SITE_OTHER): Payer: Self-pay | Admitting: Orthopaedic Surgery

## 2020-03-27 ENCOUNTER — Encounter: Payer: Self-pay | Admitting: Orthopaedic Surgery

## 2020-03-27 ENCOUNTER — Other Ambulatory Visit: Payer: Self-pay

## 2020-03-27 VITALS — BP 220/117 | HR 73 | Ht 68.0 in | Wt 304.0 lb

## 2020-03-27 DIAGNOSIS — M25562 Pain in left knee: Secondary | ICD-10-CM

## 2020-03-27 DIAGNOSIS — G8929 Other chronic pain: Secondary | ICD-10-CM

## 2020-03-27 DIAGNOSIS — Z6841 Body Mass Index (BMI) 40.0 and over, adult: Secondary | ICD-10-CM

## 2020-03-27 NOTE — Progress Notes (Signed)
Patient [name], female DOB:1960-12-27, 59 y.o. [id number]  Chief Complaint  Patient presents with  . Knee Pain    left no better with injection  . Results    review MRI     HPI  Jenna Mccann is a 59 y.o. female who has pain in the left knee that is not improving.  She had MRI which showed: IMPRESSION: Dominant finding is severe osteoarthritis about the knee which is worst in the lateral compartment.  Degenerative maceration posterior horn and body of the lateral meniscus.  Chronic, complete ACL tear.  Small Baker's cyst.  I have explained the findings to her.  I will have her see Dr. Aline Brochure to discuss total knee.  However, the hospital is not allowing total joints at this time secondary to increased Covid 19 problems.  She understands.     Body mass index is 46.22 kg/m. The patient meets the AMA guidelines for Morbid (severe) obesity with a BMI > 40.0 and I have recommended weight loss.  ROS  Review of Systems  Constitutional: Positive for activity change.  Musculoskeletal: Positive for arthralgias, gait problem and joint swelling.  All other systems reviewed and are negative.   All other systems reviewed and are negative.  The following is a summary of the past history medically, past history surgically, known current medicines, social history and family history.  This information is gathered electronically by the computer from prior information and documentation.  I review this each visit and have found including this information at this point in the chart is beneficial and informative.    Past Medical History:  Diagnosis Date  . Diverticulitis   . History of cardiac catheterization    LHC during admit for pericarditis 8/16:  normal coronary arteries  . History of echocardiogram    a. Echo 03/03/15:  mild to mod LVH, EF 55-60%, no RWMA, mildly dilated aortic root, mild LAE  . Normocytic anemia   . Pericarditis 02/2015    Past Surgical  History:  Procedure Laterality Date  . ABDOMINAL HYSTERECTOMY    . BACK SURGERY    . CARDIAC CATHETERIZATION N/A 03/02/2015   Procedure: Left Heart Cath and Coronary Angiography;  Surgeon: Belva Crome, MD;  Location: [place] [facility];  Service: Cardiovascular;  Laterality: N/A;  . CHOLECYSTECTOMY    . ROTATOR CUFF REPAIR Right     History reviewed. No pertinent family history.  Social History Social History   Tobacco Use  . Smoking status: Never Smoker  . Smokeless tobacco: Never Used  Substance Use Topics  . Alcohol use: No  . Drug use: Never    No Known Allergies  Current Outpatient Medications  Medication Sig Dispense Refill  . gabapentin (NEURONTIN) 300 MG capsule Take by mouth.    . naproxen (NAPROSYN) 500 MG tablet Take 1 tablet (500 mg total) by mouth 2 (two) times daily with a meal. 60 tablet 5  . polyethylene glycol (MIRALAX / GLYCOLAX) packet Take 17 g by mouth daily as needed for mild constipation.      No current facility-administered medications for this visit.     Physical Exam  Blood pressure (!) 220/117, pulse 73, height 5\' 8"  (1.727 m), weight (!) 304 lb (137.9 kg).  Constitutional: overall normal hygiene, normal nutrition, well developed, normal grooming, normal body habitus. Assistive device:none  Musculoskeletal: gait and station Limp left, muscle tone and strength are normal, no tremors or atrophy is present.  .  Neurological: coordination overall normal.  Deep tendon reflex/nerve stretch intact.  Sensation normal.  Cranial nerves II-XII intact.   Skin:   Normal overall no scars, lesions, ulcers or rashes. No psoriasis.  Psychiatric: Alert and oriented x 3.  Recent memory intact, remote memory unclear.  Normal mood and affect. Well groomed.  Good eye contact.  Cardiovascular: overall no swelling, no varicosities, no edema bilaterally, normal temperatures of the legs and arms, no clubbing, cyanosis and good capillary refill.  Left knee with  pain, crepitus, effusion, limp left, positive medial McMurray and Drawer signs.  Lymphatic: palpation is normal.  All other systems reviewed and are negative   The patient has been educated about the nature of the problem(s) and counseled on treatment options.  The patient appeared to understand what I have discussed and is in agreement with it.  Encounter Diagnoses  Name Primary?  . Chronic pain of left knee Yes  . Body mass index 45.0-49.9, adult ([place])   . Morbid obesity ([place])     PLAN Call if any problems.  Precautions discussed.  Continue current medications.   Return to clinic to see Dr. Aline Brochure.   Electronically Signed Sanjuana Kava, MD 8/31/20218:56 AM

## 2020-04-23 ENCOUNTER — Encounter: Payer: Self-pay | Admitting: Orthopedic Surgery

## 2020-04-23 ENCOUNTER — Other Ambulatory Visit: Payer: Self-pay

## 2020-04-23 ENCOUNTER — Ambulatory Visit (INDEPENDENT_AMBULATORY_CARE_PROVIDER_SITE_OTHER): Payer: Self-pay | Admitting: Orthopedic Surgery

## 2020-04-23 VITALS — BP 142/90 | Ht 68.0 in | Wt 308.0 lb

## 2020-04-23 DIAGNOSIS — M25562 Pain in left knee: Secondary | ICD-10-CM

## 2020-04-23 DIAGNOSIS — G8929 Other chronic pain: Secondary | ICD-10-CM

## 2020-04-23 DIAGNOSIS — Z6841 Body Mass Index (BMI) 40.0 and over, adult: Secondary | ICD-10-CM

## 2020-04-23 MED ORDER — TRAMADOL-ACETAMINOPHEN 37.5-325 MG PO TABS: 1.0000 | ORAL_TABLET | ORAL | 0 refills | Status: DC | PRN

## 2020-04-23 NOTE — Progress Notes (Signed)
Chief Complaint  Patient presents with  . Knee Pain    chronic left knee pain, consult for possible surgery,     Assessment and plan:  Encounter Diagnoses  Name Primary?  . Body mass index 45.0-49.9, adult ([place]) Yes  . Chronic pain of left knee   . Morbid obesity ([place])      59 year old female with obesity BMI 46 presents for possible left total knee replacement.  Unfortunately, patient would require inpatient surgery which we cannot do at this time due to Covid restrictions.  She also has to lose 50 pounds.  I discussed this with her and I am having her see a nutritionist.  She was to continue her naproxen and start tramadol for pain.  She will call us back when her weight has gotten better.  History 59 year old female with chronic right knee pain.  She received some injections in the knee did not improve.  She is having significant pain loss of mobility trouble bending her knee with night pain occasional giving way and locking of the knee as well.  Past Medical History:  Diagnosis Date  . Diverticulitis   . History of cardiac catheterization    LHC during admit for pericarditis 8/16:  normal coronary arteries  . History of echocardiogram    a. Echo 03/03/15:  mild to mod LVH, EF 55-60%, no RWMA, mildly dilated aortic root, mild LAE  . Normocytic anemia   . Pericarditis 02/2015   Past Surgical History:  Procedure Laterality Date  . ABDOMINAL HYSTERECTOMY    . BACK SURGERY    . CARDIAC CATHETERIZATION N/A 03/02/2015   Procedure: Left Heart Cath and Coronary Angiography;  Surgeon: Belva Crome, MD;  Location: [facility];  Service: Cardiovascular;  Laterality: N/A;  . CHOLECYSTECTOMY    . ROTATOR CUFF REPAIR Right      BP (!) 142/90   Ht 5\' 8"  (1.727 m)   Wt (!) 308 lb (139.7 kg)   BMI 46.83 kg/m    Patient is obese otherwise normal development grooming and hygiene  She is awake alert and oriented x3  Mood is pleasant affect is normal  Gait is altered with  decreased extension decreased stride length decreased weightbearing time in extension  Left knee exam patient's flexion is less than 90 degrees extension 15 degree contracture pain crepitance on range of motion.  Thigh circumference and calf circumference are excessive as well.  Knee felt stable as could be tested  Extension strength was normal  Pulse and perfusion normal with peripheral edema  Normal sensation in the extremity  I reviewed her MRI images which were from outside facility showing that she has severe arthritis all 3 compartments with chronic ACL tear  Her plain films show slight valgus alignment to the knee with joint space narrowing in all 3 compartments, severe  Meds ordered this encounter  Medications  . traMADol-acetaminophen (ULTRACET) 37.5-325 MG tablet    Sig: Take 1 tablet by mouth every 4 (four) hours as needed for up to 7 days.    Dispense:  42 tablet    Refill:  0

## 2020-04-23 NOTE — Patient Instructions (Addendum)
Weight loss to 260 lbs  Continue naproxen  Start ultracet 1 every 6 hrs for pain   F/u when weight is 260 lbs   Consult nutritionist

## 2020-05-10 ENCOUNTER — Other Ambulatory Visit: Payer: Self-pay | Admitting: Orthopedic Surgery

## 2020-05-10 DIAGNOSIS — G8929 Other chronic pain: Secondary | ICD-10-CM

## 2020-05-10 DIAGNOSIS — M25562 Pain in left knee: Secondary | ICD-10-CM

## 2020-05-10 NOTE — Telephone Encounter (Signed)
Rx request 

## 2020-05-11 ENCOUNTER — Other Ambulatory Visit: Payer: Self-pay

## 2020-05-11 ENCOUNTER — Encounter: Payer: Self-pay | Admitting: Dietician

## 2020-05-11 ENCOUNTER — Encounter: Payer: Self-pay | Attending: Physician Assistant | Admitting: Dietician

## 2020-05-11 DIAGNOSIS — E785 Hyperlipidemia, unspecified: Secondary | ICD-10-CM | POA: Insufficient documentation

## 2020-05-11 NOTE — Patient Instructions (Addendum)
Use your cane! Take care of your knee until surgery!  Look up "Arm Chair Exercises" on YouTube. Try using your 5 pound dumb bells or canned good for curls or overhead lifts. 30 minutes a day while in your recliner at 8:00 am.  Eat 3 meals a day!  Follow the balanced plate method: 1/4 Starches 1/4 Protein 1/2 Non-starchy vegetables  Have a low-fat milk with your cereal.  Aim for 2 pounds of weight loss per week!  Follow Up every month until knee surgery

## 2020-05-11 NOTE — Progress Notes (Signed)
Medical Nutrition Therapy:  Appt start time: 0930 end time:  1030.   Assessment:  Primary concerns today: Weight Loss.   Pt experiences chronic, severe knee pain. Pt needs to lose 50 pounds to be eligible for surgery, needs to be 260 lbs. Pt reports always weighing 280-290 lbs. States since her knee issue began 6 months, she gained about 20 pounds. Pt has noticed her clothes fitting tighter. Pt usually eats breakfast, then skips lunch, and over indulges at dinner because she is so hungry. Pt reports smelling all of her food before she eats it, and has smell aversions to food that "stink". Pt reports taste changes after starting her Naproxen prescription.   Pt is very confident and determined to lose weight.  Preferred Learning Style:   No preference indicated   Learning Readiness:   Not ready  Contemplating  Ready  Change in progress   MEDICATIONS: Naproxen   DIETARY INTAKE:  Usual eating pattern includes 2 meals and occasionally 1 snacks per day.  Avoided foods include Spinach, celery.    24-hr recall:  B (8 AM): Sausage McMuffin, black coffee  Snk ( AM): none L ( PM): none Snk ( PM): none D (5:30 PM): Grilled chicken sub, tomatoes, onions, pickles, Sprite Snk ( PM): none Beverages: Black Coffee, Sprite, 64 oz water.  Usual physical activity: ADLs   Estimated energy needs: 1800 calories 200 g carbohydrates 135 g protein 50 g fat  Progress Towards Goal(s):  In progress.   Nutritional Diagnosis:  NB-1.1 Food and nutrition-related knowledge deficit As related to morbid obesity.  As evidenced by BMI of 47.38.    Intervention:  Nutrition Education.  Educated patient on the balanced plate eating model. Recommended lunch and dinner be 1/2 non-starchy vegetables, 1/4 starches, and 1/4 protein. Recommended breakfast be a balance of starch and protein with a piece of fruit. Discussed with patient the importance of working towards hitting the proportions of the  balanced plate consistently. Counseled patient on ways to begin recognizing each of the food groups from the balanced plate in their own meals, and how close they are to fitting the recommended proportions of the balanced plate. Educated patient on the nutritional value of each food group on the balanced plate model.   Goals:  Use your cane! Take care of your knee until surgery!  Look up "Arm Chair Exercises" on YouTube. Try using your 5 pound dumb bells or canned good for curls or overhead lifts. 30 minutes a day while in your recliner at 8:00 am.  Eat 3 meals a day!  Follow the balanced plate method:  1/4 Starches  1/4 Protein  1/2 Non-starchy vegetables  Have a low-fat milk with your cereal.  Aim for 2 pounds of weight loss per week!  Follow up every month until knee surgery  Teaching Method Utilized:  Visual Auditory   Handouts given during visit include:  MyPlate Balanced Plate   Balanced Plate food list  Weight Loss Instructions  Barriers to learning/adherence to lifestyle change: Chronic Knee Pain  Demonstrated degree of understanding via:  Teach Back   Monitoring/Evaluation:  Dietary intake, exercise, meal pattern, and body weight in 1 month(s).

## 2020-06-14 ENCOUNTER — Other Ambulatory Visit: Payer: Self-pay | Admitting: Orthopedic Surgery

## 2020-06-14 DIAGNOSIS — G8929 Other chronic pain: Secondary | ICD-10-CM

## 2020-06-14 NOTE — Telephone Encounter (Signed)
Patient of Dr. Ruthe Mannan is requesting some stronger for pain.  She uses [facility] in [place]

## 2020-06-15 ENCOUNTER — Other Ambulatory Visit: Payer: Self-pay

## 2020-06-15 ENCOUNTER — Encounter: Payer: Self-pay | Admitting: Orthopedic Surgery

## 2020-06-15 ENCOUNTER — Encounter: Payer: Self-pay | Attending: Physician Assistant | Admitting: Dietician

## 2020-06-15 ENCOUNTER — Encounter: Payer: Self-pay | Admitting: Dietician

## 2020-06-15 DIAGNOSIS — E785 Hyperlipidemia, unspecified: Secondary | ICD-10-CM | POA: Insufficient documentation

## 2020-06-15 MED ORDER — TRAMADOL-ACETAMINOPHEN 37.5-325 MG PO TABS: ORAL_TABLET | ORAL | 0 refills | Status: DC

## 2020-06-15 NOTE — Telephone Encounter (Signed)
I called the patient and she voices understanding. She is going to nutrition and is starting to lose some weight. She wants to know if you could call in some more Tramadol. Please advise.

## 2020-06-15 NOTE — Telephone Encounter (Signed)
denied °

## 2020-06-15 NOTE — Progress Notes (Signed)
Medical Nutrition Therapy:  Appt start time: 0900 end time:  0930.   Assessment:  Primary concerns today: Weight Loss.    Pt lost 2 pounds since last visit. Reports her knee pain is worsening, her pain medication is not working anymore. States that there is no relief from her knee pain. Reports that her knee pain is effecting her mood, and motivation. Pt reports cutting back on a lot of food items, and drinking more water. Pt is doing arm chair exercises in 5-6 minute increments for about 30-40 minutes a day. Pt is no longer eating fried foods. Eating more baked and broiled foods.   Pt has been eating fish now, and trying new foods.  Pt is working with her sister to lose weight together, both are trying to lose weight. Pt inquired about [facility] system.   Preferred Learning Style:   No preference indicated   Learning Readiness:   Contemplating    MEDICATIONS: Naproxen   DIETARY INTAKE:  Usual eating pattern includes 3 meals and occasionally 1 snacks per day.  Avoided foods include Spinach, celery.    24-hr recall:  B (8 AM): 2 boiled eggs, slice of wheat toast Snk ( AM):  L ( PM): Chicken salad on a romaine lettuce Snk ( PM):  D (5:30 PM): Broiled chicken tips, sweet potato Snk ( PM):  Beverages: water  Usual physical activity: ADLs, armchair exercises   Estimated energy needs: 1800 calories 200 g carbohydrates 135 g protein 50 g fat  Progress Towards Goal(s):  In progress.   Nutritional Diagnosis:  NB-1.1 Food and nutrition-related knowledge deficit As related to morbid obesity.  As evidenced by BMI of 47.38.    Intervention:  Nutrition Education.  Educated patient on the balanced plate eating model. Recommended lunch and dinner be 1/2 non-starchy vegetables, 1/4 starches, and 1/4 protein. Recommended breakfast be a balance of starch and protein with a piece of fruit. Discussed with patient the importance of working towards hitting the proportions of  the balanced plate consistently. Counseled patient on ways to begin recognizing each of the food groups from the balanced plate in their own meals, and how close they are to fitting the recommended proportions of the balanced plate. Educated patient on the nutritional value of each food group on the balanced plate model. Advise pt to avoid fad diets and weight loss supplements.  Goals:  Look into a second opinion on your knee.  Contact your doctor's office about pain medication for your knee.  Work towards doing your armchair exercises for 30 minutes at a time to get the maximum effects of your physical activity.  Try to drink more water during the day time instead of the evening.  KEEP UP THE GREAT WORK  Teaching Method Utilized:  Visual Auditory   Handouts given during visit include:  MyPlate Balanced Plate   Balanced Plate food list  Weight Loss Instructions  Barriers to learning/adherence to lifestyle change: Chronic Knee Pain  Demonstrated degree of understanding via:  Teach Back   Monitoring/Evaluation:  Dietary intake, exercise, meal pattern, knee pain, and body weight in 1 month(s).

## 2020-06-15 NOTE — Progress Notes (Unsigned)
59 yo female patient of the practice   I consulted for surgery , I referred her to nutritionist to lose weight   She s on naproxen and tramadol.  I m not rec any more pain meds per recent data regarding opioids not effective in OA   OK to see Dr Raliegh Ip until she meets surgical criteria

## 2020-06-15 NOTE — Patient Instructions (Addendum)
Look into a second opinion on your knee.  Contact your doctor's office about pain medication for your knee.  Work towards doing your armchair exercises for 30 minutes at a time to get the maximum effects of your physical activity.  Try to drink more water during the day time instead of the evening.  KEEP UP THE GREAT WORK

## 2020-07-06 ENCOUNTER — Ambulatory Visit: Payer: Self-pay | Admitting: Dietician

## 2020-07-10 ENCOUNTER — Ambulatory Visit (INDEPENDENT_AMBULATORY_CARE_PROVIDER_SITE_OTHER): Payer: Self-pay | Admitting: Orthopedic Surgery

## 2020-07-10 DIAGNOSIS — M1712 Unilateral primary osteoarthritis, left knee: Secondary | ICD-10-CM

## 2020-07-10 DIAGNOSIS — Z6841 Body Mass Index (BMI) 40.0 and over, adult: Secondary | ICD-10-CM

## 2020-07-12 ENCOUNTER — Encounter: Payer: Self-pay | Admitting: Orthopedic Surgery

## 2020-07-12 NOTE — Progress Notes (Signed)
Office Visit Note   Patient: Jenna Mccann           Date of Birth: 04-21-1961           MRN: [id number] Visit Date: 07/10/2020              Requested by: [address], O'Laf, PA-C [place],   [address] PCP: Sofie Rower, PA-C  Chief Complaint  Patient presents with  . Left Knee - Pain      HPI: Patient is a 59 year old woman who is seen for a second opinion regarding osteoarthritis of her left knee.  Patient has had an MRI scan which shows incompetence of the ACL as well as degenerative arthritic changes.  Patient states she has had chronic knee pain difficulty getting from a sitting to a standing position she states she cannot deal with this anymore.  Patient states she has seen a nutritionist to help with her diet and weight loss.  Assessment & Plan: Visit Diagnoses:  1. Morbid obesity ([place])   2. Morbid obesity with BMI of 40.0-44.9, adult ([place])   3. Unilateral primary osteoarthritis, left knee     Plan: Recommended continue nutrition and weight loss and exercise.  Discussed that she would need to get her BMI down to 40 which would be a weight less than 275 pounds.  Follow-Up Instructions: Return if symptoms worsen or fail to improve.   Ortho Exam  Patient is alert, oriented, no adenopathy, well-dressed, normal affect, normal respiratory effort. Examination patient has difficulty getting from a sitting to a standing position.  Injections in her knee in the past have provided temporary relief nonsteroidals have provided temporary relief she has tried Voltaren gel she is currently on Ultram.  She has crepitation with range of motion of her knee varus and valgus stress is stable.  Calculated BMI today is 45 with a height of 5 foot 9 weight 308 pounds.  Hemoglobin A1c normal no evidence of diabetes.  Imaging: No results found. No images are attached to the encounter.  Labs: Lab Results  Component Value Date   HGBA1C 5.1 03/03/2015     Lab  Results  Component Value Date   ALBUMIN 3.6 11/20/2017    No results found for: MG No results found for: VD25OH  No results found for: PREALBUMIN CBC EXTENDED Latest Ref Rng & Units 08/02/2019 11/19/2017 03/04/2015  WBC 4.0 - 10.5 K/uL 6.5 5.4 4.6  RBC 3.87 - 5.11 MIL/uL 4.37 3.70(L) 3.84(L)  HGB 12.0 - 15.0 g/dL 12.7 10.7(L) 10.8(L)  HCT 36.0 - 46.0 % 39.6 31.7(L) 33.3(L)  PLT 150 - 400 K/uL 258 197 213     There is no height or weight on file to calculate BMI.  Orders:  No orders of the defined types were placed in this encounter.  No orders of the defined types were placed in this encounter.    Procedures: No procedures performed  Clinical Data: No additional findings.  ROS:  All other systems negative, except as noted in the HPI. Review of Systems  Objective: Vital Signs: There were no vitals taken for this visit.  Specialty Comments:  No specialty comments available.  PMFS History: Patient Active Problem List   Diagnosis Date Noted  . Acute pericarditis 03/04/2015  . Morbid obesity ([place]) 03/04/2015  . Normocytic anemia 03/04/2015  . Abnormal EKG 03/02/2015  . Chest pain   . Frequent UTI 07/05/2014  . Incomplete emptying of bladder 07/05/2014  . Dyslipidemia  06/29/2014  . Tubular adenoma of colon 05/09/2014  . Colon, diverticulosis 05/06/2014  . Diverticulosis of large intestine without hemorrhage 05/06/2014  . Morbid obesity with BMI of 40.0-44.9, adult ([place]) 03/27/2014   Past Medical History:  Diagnosis Date  . Diverticulitis   . History of cardiac catheterization    LHC during admit for pericarditis 8/16:  normal coronary arteries  . History of echocardiogram    a. Echo 03/03/15:  mild to mod LVH, EF 55-60%, no RWMA, mildly dilated aortic root, mild LAE  . Normocytic anemia   . Pericarditis 02/2015    History reviewed. No pertinent family history.  Past Surgical History:  Procedure Laterality Date  . ABDOMINAL HYSTERECTOMY    . BACK SURGERY     . CARDIAC CATHETERIZATION N/A 03/02/2015   Procedure: Left Heart Cath and Coronary Angiography;  Surgeon: Belva Crome, MD;  Location: [facility];  Service: Cardiovascular;  Laterality: N/A;  . CHOLECYSTECTOMY    . ROTATOR CUFF REPAIR Right    Social History   Occupational History  . Not on file  Tobacco Use  . Smoking status: Never Smoker  . Smokeless tobacco: Never Used  Substance and Sexual Activity  . Alcohol use: No  . Drug use: Never  . Sexual activity: Not Currently

## 2020-08-10 ENCOUNTER — Ambulatory Visit: Payer: Self-pay | Admitting: Dietician

## 2020-08-31 ENCOUNTER — Telehealth: Payer: Self-pay

## 2020-08-31 NOTE — Telephone Encounter (Signed)
Client of [facility] called to let Jenna Mccann know she now has her appointment scheduled for 09/13/20 with [facility]. Diane Luciana Axe is assisting client with her [facility] financial assistance application. Email sent to D. Luciana Axe regarding above information and requested a return call to client.  Debria Garret RN [employer]

## 2020-09-13 ENCOUNTER — Ambulatory Visit: Payer: Self-pay | Admitting: Orthopedic Surgery

## 2020-09-25 ENCOUNTER — Encounter: Payer: Self-pay | Admitting: Orthopedic Surgery

## 2020-09-25 ENCOUNTER — Ambulatory Visit (INDEPENDENT_AMBULATORY_CARE_PROVIDER_SITE_OTHER): Payer: Self-pay | Admitting: Orthopedic Surgery

## 2020-09-25 DIAGNOSIS — M1712 Unilateral primary osteoarthritis, left knee: Secondary | ICD-10-CM

## 2020-09-25 DIAGNOSIS — Z6841 Body Mass Index (BMI) 40.0 and over, adult: Secondary | ICD-10-CM

## 2020-09-25 NOTE — Progress Notes (Signed)
Office Visit Note   Patient: Jenna Mccann           Date of Birth: 1960-10-16           MRN: [id number] Visit Date: 09/25/2020              Requested by: [address] [place], PA-C [place],  [place] [address] PCP: Sofie Rower, PA-C  Chief Complaint  Patient presents with  . Left Knee - Pain      HPI: Patient is a 60 year old woman with tricompartmental osteoarthritis of her left knee she states that she cannot do activities of daily living due to pain she states that she has fallen difficulty sleeping difficulty with range of motion.  She states she is currently going to a bariatric clinic is trying to lose some weight.  She is used topical anti-inflammatories without relief.  Patient states that she has [facility] health plan.  Assessment & Plan: Visit Diagnoses:  1. Morbid obesity ([place])   2. Morbid obesity with BMI of 40.0-44.9, adult ([place])   3. Unilateral primary osteoarthritis, left knee     Plan: Recommended continued weight loss.  Discussed that with the elevated BMI she has an increased risk of infection potential for amputation.  Patient states she understands she will continue with weight loss follow-up in 2 months.  Follow-Up Instructions: Return in about 2 months (around 11/25/2020).   Ortho Exam  Patient is alert, oriented, no adenopathy, well-dressed, normal affect, normal respiratory effort. Examination patient has difficulty getting from a sitting to a standing position she has an antalgic gait with a stiff leg gait on the left she has decreased range of motion of the left knee there is global tenderness to palpation.  Radiographs shows valgus alignment with bone-on-bone contact in all 3 compartments.  Imaging: No results found. No images are attached to the encounter.  Labs: Lab Results  Component Value Date   HGBA1C 5.1 03/03/2015     Lab Results  Component Value Date   ALBUMIN 3.6 11/20/2017    No results found for: MG No  results found for: VD25OH  No results found for: PREALBUMIN CBC EXTENDED Latest Ref Rng & Units 08/02/2019 11/19/2017 03/04/2015  WBC 4.0 - 10.5 K/uL 6.5 5.4 4.6  RBC 3.87 - 5.11 MIL/uL 4.37 3.70(L) 3.84(L)  HGB 12.0 - 15.0 g/dL 12.7 10.7(L) 10.8(L)  HCT 36.0 - 46.0 % 39.6 31.7(L) 33.3(L)  PLT 150 - 400 K/uL 258 197 213     There is no height or weight on file to calculate BMI.  Orders:  No orders of the defined types were placed in this encounter.  No orders of the defined types were placed in this encounter.    Procedures: No procedures performed  Clinical Data: No additional findings.  ROS:  All other systems negative, except as noted in the HPI. Review of Systems  Objective: Vital Signs: There were no vitals taken for this visit.  Specialty Comments:  No specialty comments available.  PMFS History: Patient Active Problem List   Diagnosis Date Noted  . Acute pericarditis 03/04/2015  . Morbid obesity ([place]) 03/04/2015  . Normocytic anemia 03/04/2015  . Abnormal EKG 03/02/2015  . Chest pain   . Frequent UTI 07/05/2014  . Incomplete emptying of bladder 07/05/2014  . Dyslipidemia 06/29/2014  . Tubular adenoma of colon 05/09/2014  . Colon, diverticulosis 05/06/2014  . Diverticulosis of large intestine without hemorrhage 05/06/2014  . Morbid obesity with BMI of  40.0-44.9, adult ([place]) 03/27/2014   Past Medical History:  Diagnosis Date  . Diverticulitis   . History of cardiac catheterization    LHC during admit for pericarditis 8/16:  normal coronary arteries  . History of echocardiogram    a. Echo 03/03/15:  mild to mod LVH, EF 55-60%, no RWMA, mildly dilated aortic root, mild LAE  . Normocytic anemia   . Pericarditis 02/2015    History reviewed. No pertinent family history.  Past Surgical History:  Procedure Laterality Date  . ABDOMINAL HYSTERECTOMY    . BACK SURGERY    . CARDIAC CATHETERIZATION N/A 03/02/2015   Procedure: Left Heart Cath and Coronary  Angiography;  Surgeon: Belva Crome, MD;  Location: [facility];  Service: Cardiovascular;  Laterality: N/A;  . CHOLECYSTECTOMY    . ROTATOR CUFF REPAIR Right    Social History   Occupational History  . Not on file  Tobacco Use  . Smoking status: Never Smoker  . Smokeless tobacco: Never Used  Substance and Sexual Activity  . Alcohol use: No  . Drug use: Never  . Sexual activity: Not Currently

## 2020-12-25 ENCOUNTER — Ambulatory Visit (INDEPENDENT_AMBULATORY_CARE_PROVIDER_SITE_OTHER): Payer: Self-pay | Admitting: Physician Assistant

## 2020-12-25 ENCOUNTER — Encounter: Payer: Self-pay | Admitting: Orthopedic Surgery

## 2020-12-25 VITALS — Ht 68.0 in | Wt 281.0 lb

## 2020-12-25 DIAGNOSIS — M1712 Unilateral primary osteoarthritis, left knee: Secondary | ICD-10-CM

## 2020-12-25 NOTE — Progress Notes (Signed)
Office Visit Note   Patient: Jenna Mccann           Date of Birth: 08-Dec-1960           MRN: [id number] Visit Date: 12/25/2020              Requested by: [address], [place], PA-C [place],  [place] [address] PCP: Sofie Rower, PA-C  Chief Complaint  Patient presents with  . Left Knee - Pain      HPI: Patient is a pleasant 60 year old woman with history of tricompartmental arthritis of her left knee.  She has failed conservative treatment.  She has had a discussion with Dr. Sharol Given regarding going forward with knee replacement surgery however her BMI was too high.  She has engaged with bariatric clinic.  She has dropped her BMI from 46-42.  She would like to schedule knee replacement.  She said that Dr. Sharol Given had told her that she needed to weigh 275 pounds or less.  She weighs 281 today  Assessment & Plan: Visit Diagnoses: No diagnosis found.  Plan: We will plan for surgery in approximately a month.  I believe the patient is motivated.  We reviewed the risks of history of surgery including bleeding infection anesthesia complications blood clots need for revision surgery  Follow-Up Instructions: No follow-ups on file.   Ortho Exam  Patient is alert, oriented, no adenopathy, well-dressed, normal affect, normal respiratory effort. Left knee no effusion no cellulitis she is tender over the lateral joint line and over the patellofemoral joint.  She has valgus alignment lungs clear bilaterally heart regular rate and rhythm  Imaging: No results found. No images are attached to the encounter.  Labs: Lab Results  Component Value Date   HGBA1C 5.1 03/03/2015     Lab Results  Component Value Date   ALBUMIN 3.6 11/20/2017    No results found for: MG No results found for: VD25OH  No results found for: PREALBUMIN CBC EXTENDED Latest Ref Rng & Units 08/02/2019 11/19/2017 03/04/2015  WBC 4.0 - 10.5 K/uL 6.5 5.4 4.6  RBC 3.87 - 5.11 MIL/uL 4.37 3.70(L) 3.84(L)   HGB 12.0 - 15.0 g/dL 12.7 10.7(L) 10.8(L)  HCT 36.0 - 46.0 % 39.6 31.7(L) 33.3(L)  PLT 150 - 400 K/uL 258 197 213     Body mass index is 42.73 kg/m.  Orders:  No orders of the defined types were placed in this encounter.  No orders of the defined types were placed in this encounter.    Procedures: No procedures performed  Clinical Data: No additional findings.  ROS:  All other systems negative, except as noted in the HPI. Review of Systems  Objective: Vital Signs: Ht 5\' 8"  (1.727 m)   Wt 281 lb (127.5 kg)   BMI 42.73 kg/m   Specialty Comments:  No specialty comments available.  PMFS History: Patient Active Problem List   Diagnosis Date Noted  . Acute pericarditis 03/04/2015  . Morbid obesity ([place]) 03/04/2015  . Normocytic anemia 03/04/2015  . Abnormal EKG 03/02/2015  . Chest pain   . Frequent UTI 07/05/2014  . Incomplete emptying of bladder 07/05/2014  . Dyslipidemia 06/29/2014  . Tubular adenoma of colon 05/09/2014  . Colon, diverticulosis 05/06/2014  . Diverticulosis of large intestine without hemorrhage 05/06/2014  . Morbid obesity with BMI of 40.0-44.9, adult ([place]) 03/27/2014   Past Medical History:  Diagnosis Date  . Diverticulitis   . History of cardiac catheterization    LHC  during admit for pericarditis 8/16:  normal coronary arteries  . History of echocardiogram    a. Echo 03/03/15:  mild to mod LVH, EF 55-60%, no RWMA, mildly dilated aortic root, mild LAE  . Normocytic anemia   . Pericarditis 02/2015    No family history on file.  Past Surgical History:  Procedure Laterality Date  . ABDOMINAL HYSTERECTOMY    . BACK SURGERY    . CARDIAC CATHETERIZATION N/A 03/02/2015   Procedure: Left Heart Cath and Coronary Angiography;  Surgeon: Belva Crome, MD;  Location: [facility];  Service: Cardiovascular;  Laterality: N/A;  . CHOLECYSTECTOMY    . ROTATOR CUFF REPAIR Right    Social History   Occupational History  . Not on file  Tobacco  Use  . Smoking status: Never Smoker  . Smokeless tobacco: Never Used  Substance and Sexual Activity  . Alcohol use: No  . Drug use: Never  . Sexual activity: Not Currently

## 2021-01-31 ENCOUNTER — Other Ambulatory Visit: Payer: Self-pay | Admitting: Physician Assistant

## 2021-02-06 NOTE — Pre-Procedure Instructions (Signed)
Surgical Instructions    Your procedure is scheduled on Wednesday July 20th.   Report to [facility] Main Entrance "A" at 06:30 A.M., then check in with the Admitting office.  Call this number if you have problems the morning of surgery:  [phone]   If you have any questions prior to your surgery date call [phone]: Open Monday-Friday 8am-4pm    Remember:  Do not eat after midnight the night before your surgery  You may drink clear liquids until 05:30 A.M. the morning of your surgery.   Clear liquids allowed are: Water, Non-Citrus Juices (without pulp), Carbonated Beverages, Clear Tea, Black Coffee Only, and Gatorade  Patient Instructions  The night before surgery:  No food after midnight. ONLY clear liquids after midnight  The day of surgery (if you do NOT have diabetes):  Drink ONE (1) Pre-Surgery Clear Ensure by 05:30 A.M. the morning of surgery. Drink in one sitting. Do not sip.  This drink was given to you during your hospital  pre-op appointment visit.  Nothing else to drink after completing the  Pre-Surgery Clear Ensure.         If you have questions, please contact your surgeon's office.     Take these medicines the morning of surgery with A SIP OF WATER   gabapentin (NEURONTIN)    As of today, STOP taking any Aspirin (unless otherwise instructed by your surgeon) Aleve, Naproxen, Ibuprofen, Motrin, Advil, Goody's, BC's, all herbal medications, fish oil, and all vitamins.          Do not wear jewelry or makeup Do not wear lotions, powders, perfumes/colognes, or deodorant. Do not shave 48 hours prior to surgery.  Men may shave face and neck. Do not bring valuables to the hospital. DO Not wear nail polish, gel polish, artificial nails, or any other type of covering on  natural nails including finger and toenails. If patients have artificial nails, gel coating, etc. that need to be removed by a nail salon please have this removed prior to surgery or surgery  may need to be canceled/delayed if the surgeon/ anesthesia feels like the patient is unable to be adequately monitored.             [facility] is not responsible for any belongings or valuables.  Do NOT Smoke (Tobacco/Vaping) or drink Alcohol 24 hours prior to your procedure If you use a CPAP at night, you may bring all equipment for your overnight stay.   Contacts, glasses, dentures or bridgework may not be worn into surgery, please bring cases for these belongings   For patients admitted to the hospital, discharge time will be determined by your treatment team.   Patients discharged the day of surgery will not be allowed to drive home, and someone needs to stay with them for 24 hours.  ONLY 1 SUPPORT PERSON MAY BE PRESENT WHILE YOU ARE IN SURGERY. IF YOU ARE TO BE ADMITTED ONCE YOU ARE IN YOUR ROOM YOU WILL BE ALLOWED TWO (2) VISITORS.  Minor children may have two parents present. Special consideration for safety and communication needs will be reviewed on a case by case basis.  Special instructions:    Oral Hygiene is also important to reduce your risk of infection.  Remember - BRUSH YOUR TEETH THE MORNING OF SURGERY WITH YOUR REGULAR TOOTHPASTE   [facility]- Preparing For Surgery  Before surgery, you can play an important role. Because skin is not sterile, your skin needs to be as free of germs  as possible. You can reduce the number of germs on your skin by washing with CHG (chlorahexidine gluconate) Soap before surgery.  CHG is an antiseptic cleaner which kills germs and bonds with the skin to continue killing germs even after washing.     Please do not use if you have an allergy to CHG or antibacterial soaps. If your skin becomes reddened/irritated stop using the CHG.  Do not shave (including legs and underarms) for at least 48 hours prior to first CHG shower. It is OK to shave your face.  Please follow these instructions carefully.     Shower the NIGHT BEFORE SURGERY and the  MORNING OF SURGERY with CHG Soap.   If you chose to wash your hair, wash your hair first as usual with your normal shampoo. After you shampoo, rinse your hair and body thoroughly to remove the shampoo.  Then [employer] and genitals (private parts) with your normal soap and rinse thoroughly to remove soap.  After that Use CHG Soap as you would any other liquid soap. You can apply CHG directly to the skin and wash gently with a scrungie or a clean washcloth.   Apply the CHG Soap to your body ONLY FROM THE NECK DOWN.  Do not use on open wounds or open sores. Avoid contact with your eyes, ears, mouth and genitals (private parts). Wash Face and genitals (private parts)  with your normal soap.   Wash thoroughly, paying special attention to the area where your surgery will be performed.  Thoroughly rinse your body with warm water from the neck down.  DO NOT shower/wash with your normal soap after using and rinsing off the CHG Soap.  Pat yourself dry with a CLEAN TOWEL.  Wear CLEAN PAJAMAS to bed the night before surgery  Place CLEAN SHEETS on your bed the night before your surgery  DO NOT SLEEP WITH PETS.   Day of Surgery:  Take a shower with CHG soap. Wear Clean/Comfortable clothing the morning of surgery Do not apply any deodorants/lotions.   Remember to brush your teeth WITH YOUR REGULAR TOOTHPASTE.   Please read over the following fact sheets that you were given.

## 2021-02-07 ENCOUNTER — Encounter (HOSPITAL_COMMUNITY): Payer: Self-pay

## 2021-02-07 ENCOUNTER — Other Ambulatory Visit: Payer: Self-pay

## 2021-02-07 ENCOUNTER — Encounter (HOSPITAL_COMMUNITY): Payer: Self-pay | Admitting: Certified Registered Nurse Anesthetist

## 2021-02-07 ENCOUNTER — Encounter (HOSPITAL_COMMUNITY): Payer: Self-pay | Admitting: Physician Assistant

## 2021-02-07 ENCOUNTER — Encounter (HOSPITAL_COMMUNITY)
Admission: RE | Admit: 2021-02-07 | Discharge: 2021-02-07 | Disposition: A | Discharge status: Home / Self Care | Payer: Self-pay | Source: Ambulatory Visit | Attending: Orthopedic Surgery | Admitting: Orthopedic Surgery

## 2021-02-07 DIAGNOSIS — Z01818 Encounter for other preprocedural examination: Secondary | ICD-10-CM | POA: Insufficient documentation

## 2021-02-07 LAB — CBC
HCT: 38.2 % (ref 36.0–46.0)
Hemoglobin: 12.4 g/dL (ref 12.0–15.0)
MCH: 28.8 pg (ref 26.0–34.0)
MCHC: 32.5 g/dL (ref 30.0–36.0)
MCV: 88.8 fL (ref 80.0–100.0)
Platelets: 261 10*3/uL (ref 150–400)
RBC: 4.3 MIL/uL (ref 3.87–5.11)
RDW: 13 % (ref 11.5–15.5)
WBC: 6.4 10*3/uL (ref 4.0–10.5)
nRBC: 0 % (ref 0.0–0.2)

## 2021-02-07 LAB — BASIC METABOLIC PANEL
Anion gap: 9 (ref 5–15)
BUN: 16 mg/dL (ref 6–20)
CO2: 27 mmol/L (ref 22–32)
Calcium: 9.3 mg/dL (ref 8.9–10.3)
Chloride: 102 mmol/L (ref 98–111)
Creatinine, Ser: 0.86 mg/dL (ref 0.44–1.00)
GFR, Estimated: >60 mL/min (ref >60)
Glucose, Bld: 84 mg/dL (ref 70–99)
Potassium: 3.3 mmol/L — ABNORMAL LOW (ref 3.5–5.1)
Sodium: 138 mmol/L (ref 135–145)

## 2021-02-07 LAB — SURGICAL PCR SCREEN
MRSA, PCR: NEGATIVE
Staphylococcus aureus: POSITIVE — AB

## 2021-02-07 NOTE — Progress Notes (Signed)
PCP - Dr. Yetta Glassman at [facility] Dept. Clinic Cardiologist - none  PPM/ICD - denies Device Orders -  Rep Notified -   Chest x-ray -  EKG -02/07/21  Stress Test -  ECHO - 03/03/15 Cardiac Cath - 03/12/15  Sleep Study - no  CPAP - no   Fasting Blood Sugar - n/a Checks Blood Sugar _____ times a day  Blood Thinner Instructions:n/a Aspirin Instructions:n/a  ERAS Protcol -clears until 0530 PRE-SURGERY Ensure or G2- Ensure  COVID TEST- scheduled for 02/11/21   Anesthesia review: yes-abnormal EKG  Patient denies shortness of breath, fever, cough and chest pain at PAT appointment   All instructions explained to the patient, with a verbal understanding of the material. Patient agrees to go over the instructions while at home for a better understanding. Patient also instructed to self quarantine after being tested for COVID-19. The opportunity to ask questions was provided.

## 2021-02-08 NOTE — Progress Notes (Signed)
Anesthesia Chart Review:  History of abnormal EKG with anterior T wave inversions notes at least 2016.  In 2016 he was worked up for an episode of chest pain.  He had a left heart cath which showed normal coronary arteries, normal LV function.  CT with slight pericardial thickening, pain resolved.  Patient described pain as a stabbing sensation at that time.  Patient was seen again in the emergency department April 2019 for episode of chest pain.  Cardiologist Dr. Harrington Challenger was consulted.  Per consult note 11/20/2017, "Pt is a 60 yo with history of CP in 2016    Cath normal   Presents with about 16 hours of CP now that is diffent en quality   Describes ad pressure, boring sensation through chest. ON exam:  Pain is intensified with pressing on sternum EKG without acute change   Trop neg   CT with scattered coronary and aortic calcifications  I do not think that pts symptoms represent angina  Probably musculoskeletal  May be related to GI illness with vomiting / wretching last weekend  I think it is OK to D/C pt  Rx NSAID (with GI prophylaxis) and Tramadol and rest  F/U with preimary MD"  Patient denied chest pain at PAT appointment.  Preop labs reviewed, unremarkable.  EKG 02/07/21: Sinus rhythm with Premature atrial complexes.  Rate 67. Incomplete right bundle branch block. T wave abnormality, consider anterior ischemia Prolonged QT. No significant change since last tracing  TTE 03/03/2015: - Left ventricle: The cavity size was normal. Wall thickness was    increased increased in a pattern of mild to moderate LVH.    Systolic function was normal. The estimated ejection fraction was    in the range of 55% to 60%. Wall motion was normal; there were no    regional wall motion abnormalities.  - Aortic root: The aortic root was mildly dilated.  - Left atrium: The atrium was mildly dilated.   Cardiac cath 03/02/2015: 1. Normal coronary arteries 2. Normal left ventricular function   Normal coronary  arteries Normal left ventricular function Prolonged continuous chest pain, thickened per cardiac CT scan, and nondiagnostic EKG raises question of Pericarditis since pulmonary embolism and coronary disease have been excluded.   Recommendations: Further management per the primary team.    Karoline Caldwell, PA-C [facility] Short Stay Center/Anesthesiology Phone [phone] 02/08/2021 2:34 PM

## 2021-02-08 NOTE — Anesthesia Preprocedure Evaluation (Addendum)
Anesthesia Evaluation  Patient identified by MRN, date of birth, ID band Patient awake    Reviewed: Allergy & Precautions, NPO status , Patient's Chart, lab work & pertinent test results  Airway        Dental   Pulmonary neg pulmonary ROS,           Cardiovascular   Episodes of prolonged chest pain 2016, 2019- deemed non-cardiac in origin  EKG 02/07/21: Sinus rhythm with Premature atrial complexes.  Rate 67. Incomplete right bundle branch block. T wave abnormality, consider anterior ischemia Prolonged QT. No significant change since last tracing  TTE 03/03/2015: - Left ventricle: The cavity size was normal. Wall thickness wasincreased increased in a pattern of mild to moderate LVH.Systolic function was normal. The estimated ejection fraction wasin the range of 55% to 60%. Wall motion was normal; there were noregional wall motion abnormalities.  - Aortic root: The aortic root was mildly dilated.  - Left atrium: The atrium was mildly dilated.   Cardiac cath 03/02/2015: 1. Normal coronary arteries 2. Normal left ventricular function   Normal coronary arteries Normal left ventricular function Prolonged continuous chest pain, thickened per cardiac CT scan, and nondiagnostic EKG raises question of Pericarditis since pulmonary embolism and coronary disease have been excluded.   Neuro/Psych negative neurological ROS  negative psych ROS   GI/Hepatic negative GI ROS, Neg liver ROS,   Endo/Other  Morbid obesityBMI 42  Renal/GU negative Renal ROS  negative genitourinary   Musculoskeletal Back surgery-    Abdominal (+) + obese,   Peds negative pediatric ROS (+)  Hematology negative hematology ROS (+) hct 38.2, plt 261   Anesthesia Other Findings   Reproductive/Obstetrics negative OB ROS                             Anesthesia Physical Anesthesia Plan  ASA: 3  Anesthesia Plan: Spinal, Regional and  MAC   Post-op Pain Management:  Regional for Post-op pain   Induction:   PONV Risk Score and Plan: 2 and Propofol infusion and TIVA  Airway Management Planned: Natural Airway and Nasal Cannula  Additional Equipment: None  Intra-op Plan:   Post-operative Plan:   Informed Consent:   Plan Discussed with:   Anesthesia Plan Comments: (SBP consistently >180 in preop, most reading >200SBP. Pt not taking BP meds, d/w pt need for visit to PCP to start on antihypertensives before having elective surgery d/t inc risk CVA, CV events under anesthesia. Dr Sharol Given aware)      Anesthesia Quick Evaluation

## 2021-02-11 ENCOUNTER — Other Ambulatory Visit (HOSPITAL_COMMUNITY)
Admission: RE | Admit: 2021-02-11 | Discharge: 2021-02-11 | Disposition: A | Discharge status: Home / Self Care | Payer: Self-pay | Source: Ambulatory Visit | Attending: Orthopedic Surgery | Admitting: Orthopedic Surgery

## 2021-02-11 DIAGNOSIS — Z01812 Encounter for preprocedural laboratory examination: Secondary | ICD-10-CM | POA: Insufficient documentation

## 2021-02-11 DIAGNOSIS — Z20822 Contact with and (suspected) exposure to covid-19: Secondary | ICD-10-CM | POA: Insufficient documentation

## 2021-02-11 LAB — SARS CORONAVIRUS 2 (TAT 6-24 HRS): SARS Coronavirus 2: NEGATIVE

## 2021-02-13 ENCOUNTER — Ambulatory Visit (HOSPITAL_COMMUNITY)
Admission: RE | Admit: 2021-02-13 | Discharge: 2021-02-13 | Disposition: A | Discharge status: Home / Self Care | Payer: Self-pay | Attending: Orthopedic Surgery | Admitting: Orthopedic Surgery

## 2021-02-13 ENCOUNTER — Encounter (HOSPITAL_COMMUNITY)
Admission: RE | Disposition: A | Discharge status: Home / Self Care | Payer: Self-pay | Source: Home / Self Care | Attending: Orthopedic Surgery

## 2021-02-13 ENCOUNTER — Other Ambulatory Visit: Payer: Self-pay

## 2021-02-13 ENCOUNTER — Encounter (HOSPITAL_COMMUNITY): Payer: Self-pay | Admitting: Orthopedic Surgery

## 2021-02-13 DIAGNOSIS — Z538 Procedure and treatment not carried out for other reasons: Secondary | ICD-10-CM | POA: Insufficient documentation

## 2021-02-13 DIAGNOSIS — M25562 Pain in left knee: Secondary | ICD-10-CM | POA: Insufficient documentation

## 2021-02-13 DIAGNOSIS — Z90710 Acquired absence of both cervix and uterus: Secondary | ICD-10-CM | POA: Insufficient documentation

## 2021-02-13 DIAGNOSIS — Z9049 Acquired absence of other specified parts of digestive tract: Secondary | ICD-10-CM | POA: Insufficient documentation

## 2021-02-13 DIAGNOSIS — Z8719 Personal history of other diseases of the digestive system: Secondary | ICD-10-CM | POA: Insufficient documentation

## 2021-02-13 DIAGNOSIS — D649 Anemia, unspecified: Secondary | ICD-10-CM | POA: Insufficient documentation

## 2021-02-13 DIAGNOSIS — E785 Hyperlipidemia, unspecified: Secondary | ICD-10-CM | POA: Insufficient documentation

## 2021-02-13 DIAGNOSIS — M1712 Unilateral primary osteoarthritis, left knee: Secondary | ICD-10-CM | POA: Insufficient documentation

## 2021-02-13 DIAGNOSIS — Z6841 Body Mass Index (BMI) 40.0 and over, adult: Secondary | ICD-10-CM | POA: Insufficient documentation

## 2021-02-13 SURGERY — ARTHROPLASTY, KNEE, TOTAL
Anesthesia: Choice | Site: Knee | Laterality: Left

## 2021-02-13 MED ORDER — PROPOFOL 10 MG/ML IV BOLUS
INTRAVENOUS | Status: AC
  Filled 2021-02-13: qty 40

## 2021-02-13 MED ORDER — DEXAMETHASONE SODIUM PHOSPHATE 10 MG/ML IJ SOLN
INTRAMUSCULAR | Status: AC
  Filled 2021-02-13: qty 1

## 2021-02-13 MED ORDER — LACTATED RINGERS IV SOLN: INTRAVENOUS | Status: DC

## 2021-02-13 MED ORDER — MIDAZOLAM HCL 2 MG/2ML IJ SOLN
INTRAMUSCULAR | Status: AC
  Filled 2021-02-13: qty 2

## 2021-02-13 MED ORDER — ONDANSETRON HCL 4 MG/2ML IJ SOLN
INTRAMUSCULAR | Status: AC
  Filled 2021-02-13: qty 2

## 2021-02-13 MED ORDER — LIDOCAINE 2% (20 MG/ML) 5 ML SYRINGE
INTRAMUSCULAR | Status: AC
  Filled 2021-02-13: qty 5

## 2021-02-13 MED ORDER — ACETAMINOPHEN 500 MG PO TABS
1000.0000 mg | ORAL_TABLET | Freq: Once | ORAL | Status: AC
  Administered 2021-02-13: 1000 mg via ORAL
  Filled 2021-02-13: qty 2

## 2021-02-13 MED ORDER — FENTANYL CITRATE (PF) 250 MCG/5ML IJ SOLN
INTRAMUSCULAR | Status: AC
  Filled 2021-02-13: qty 5

## 2021-02-13 MED ORDER — CEFAZOLIN IN SODIUM CHLORIDE 3-0.9 GM/100ML-% IV SOLN
3.0000 g | INTRAVENOUS | Status: DC
  Filled 2021-02-13: qty 100

## 2021-02-13 MED ORDER — CHLORHEXIDINE GLUCONATE 0.12 % MT SOLN
15.0000 mL | Freq: Once | OROMUCOSAL | Status: AC
  Administered 2021-02-13: 15 mL via OROMUCOSAL
  Filled 2021-02-13: qty 15

## 2021-02-13 MED ORDER — ORAL CARE MOUTH RINSE: 15.0000 mL | Freq: Once | OROMUCOSAL | Status: AC

## 2021-02-13 MED ORDER — PROPOFOL 1000 MG/100ML IV EMUL
INTRAVENOUS | Status: AC
  Filled 2021-02-13: qty 100

## 2021-02-13 NOTE — Progress Notes (Signed)
Upon arrival to Short Stay morning of surgery, pt's BP elevated 222/98, then 206/99. BP rechecked in upper and lower extremity and continued to remain 505-697'X systolic. Dr. Doroteo Glassman at bedside. Anesthesia rechecked manually and BP still remained 480'X systolic. Dr. Sharol Given notified. Decision was made for surgery to be cancelled. Dr. Sharol Given explained to the pt why surgery would have to be rescheduled. Pt to f/u with PCP and call office to reschedule

## 2021-02-13 NOTE — Progress Notes (Signed)
No IV started on pt prior to surgery cancellation. Pt was taken out in a wheelchair to the waiting area and discharged home with husband

## 2021-02-13 NOTE — Discharge Instructions (Signed)

## 2021-02-13 NOTE — H&P (Signed)
TOTAL KNEE ADMISSION H&P  Patient is being admitted for left total knee arthroplasty.  Subjective:  Chief Complaint:left knee pain.  HPI: Jenna Mccann, 60 y.o. female, has a history of pain and functional disability in the left knee due to arthritis and has failed non-surgical conservative treatments for greater than 12 weeks to includeNSAID's and/or analgesics, corticosteriod injections, and weight reduction as appropriate.  Onset of symptoms was gradual, starting 5 years ago with gradually worsening course since that time. The patient noted no past surgery on the left knee(s).  Patient currently rates pain in the left knee(s) at 5 out of 10 with activity. Patient has pain that interferes with activities of daily living.  Patient has evidence of subchondral cysts and periarticular osteophytes by imaging studies. This patient has had . There is no active infection.  Patient Active Problem List   Diagnosis Date Noted   Acute pericarditis 03/04/2015   Morbid obesity ([place]) 03/04/2015   Normocytic anemia 03/04/2015   Abnormal EKG 03/02/2015   Chest pain    Frequent UTI 07/05/2014   Incomplete emptying of bladder 07/05/2014   Dyslipidemia 06/29/2014   Tubular adenoma of colon 05/09/2014   Colon, diverticulosis 05/06/2014   Diverticulosis of large intestine without hemorrhage 05/06/2014   Morbid obesity with BMI of 40.0-44.9, adult ([place]) 03/27/2014   Past Medical History:  Diagnosis Date   Diverticulitis    History of cardiac catheterization    LHC during admit for pericarditis 8/16:  normal coronary arteries   History of echocardiogram    a. Echo 03/03/15:  mild to mod LVH, EF 55-60%, no RWMA, mildly dilated aortic root, mild LAE   Normocytic anemia    Pericarditis 02/26/2015    Past Surgical History:  Procedure Laterality Date   ABDOMINAL HYSTERECTOMY     BACK SURGERY     CARDIAC CATHETERIZATION N/A 03/02/2015   Procedure: Left Heart Cath and Coronary Angiography;  Surgeon: Belva Crome, MD;  Location: [facility];  Service: Cardiovascular;  Laterality: N/A;   CHOLECYSTECTOMY     ROTATOR CUFF REPAIR Right     No current facility-administered medications for this encounter.   No Known Allergies  Social History   Tobacco Use   Smoking status: Never   Smokeless tobacco: Never  Substance Use Topics   Alcohol use: No    No family history on file.   Review of Systems  All other systems reviewed and are negative.  Objective:  Physical Exam    Patient is alert, oriented, no adenopathy, well-dressed, normal affect, normal respiratory effort. Left knee no effusion no cellulitis she is tender over the lateral joint line and over the patellofemoral joint.  She has valgus alignment lungs clear bilaterally heart regular rate and Vital signs in last 24 hours:    Labs:   Estimated body mass index is 41.63 kg/m as calculated from the following:   Height as of 02/07/21: 5\' 8"  (1.727 m).   Weight as of 02/07/21: 124.2 kg.   Imaging Review Plain radiographs demonstrate moderate degenerative joint disease of the left knee(s). The overall alignment ismild valgus. The bone quality appears to be good for age and reported activity level.      Assessment/Plan:  End stage arthritis, left knee   The patient history, physical examination, clinical judgment of the provider and imaging studies are consistent with end stage degenerative joint disease of the left knee(s) and total knee arthroplasty is deemed medically necessary. The treatment options including medical  management, injection therapy arthroscopy and arthroplasty were discussed at length. The risks and benefits of total knee arthroplasty were presented and reviewed. The risks due to aseptic loosening, infection, stiffness, patella tracking problems, thromboembolic complications and other imponderables were discussed. The patient acknowledged the explanation, agreed to proceed with the plan and consent was  signed. Patient is being admitted for inpatient treatment for surgery, pain control, PT, OT, prophylactic antibiotics, VTE prophylaxis, progressive ambulation and ADL's and discharge planning. The patient is planning to be discharged home with home health services     Patient's anticipated LOS is less than 2 midnights, meeting these requirements: - Younger than 28 - Lives within 1 hour of care - Has a competent adult at home to recover with post-op recover - NO history of  - Chronic pain requiring opiods  - Diabetes  - Coronary Artery Disease  - Heart failure  - Heart attack  - Stroke  - DVT/VTE  - Cardiac arrhythmia  - Respiratory Failure/COPD  - Renal failure  - Anemia  - Advanced Liver disease

## 2021-02-13 NOTE — Interval H&P Note (Signed)
History and Physical Interval Note:  02/13/2021 6:39 AM  Jenna Mccann  has presented today for surgery, with the diagnosis of Left Knee Osteoarthritis.  The various methods of treatment have been discussed with the patient and family. After consideration of risks, benefits and other options for treatment, the patient has consented to  Procedure(s): LEFT TOTAL KNEE ARTHROPLASTY (Left) as a surgical intervention.  The patient's history has been reviewed, patient examined, no change in status, stable for surgery.  I have reviewed the patient's chart and labs.  Questions were answered to the patient's satisfaction.     [name]

## 2021-02-27 ENCOUNTER — Telehealth: Payer: Self-pay | Admitting: Orthopedic Surgery

## 2021-02-27 NOTE — Telephone Encounter (Signed)
Pts daughter Leafy Ro called asking to speak to Otila Kluver because the pt has been trying to get her surgery rescheduled for 3 weeks and hasn't gotten any CBs. Leafy Ro would like a CB ASAP.   [phone]

## 2021-03-08 ENCOUNTER — Other Ambulatory Visit: Payer: Self-pay | Admitting: Physician Assistant

## 2021-03-08 NOTE — Progress Notes (Signed)
Surgical Instructions    Your procedure is scheduled on 03/15/21.  Report to [facility] Main Entrance "A" at 07:45 A.M., then check in with the Admitting office.  Call this number if you have problems the morning of surgery:  [phone]   If you have any questions prior to your surgery date call [phone]: Open Monday-Friday 8am-4pm    Remember:  Do not eat after midnight the night before your surgery  You may drink clear liquids until 06:45am the morning of your surgery.   Clear liquids allowed are: Water, Non-Citrus Juices (without pulp), Carbonated Beverages, Clear Tea, Black Coffee Only, and Gatorade  Patient Instructions  The night before surgery:  No food after midnight. ONLY clear liquids after midnight  The day of surgery (if you do NOT have diabetes):  Drink ONE (1) Pre-Surgery Clear Ensure by 6:45am the morning of surgery. Drink in one sitting. Do not sip.  This drink was given to you during your hospital  pre-op appointment visit. Nothing else to drink after completing the  Pre-Surgery Clear Ensure.         If you have questions, please contact your surgeon's office.     Take these medicines the morning of surgery with A SIP OF WATER  DULoxetine (CYMBALTA) gabapentin (NEURONTIN)   As of today, STOP taking any Aspirin (unless otherwise instructed by your surgeon) Aleve, Naproxen, Ibuprofen, Motrin, Advil, Goody's, BC's, all herbal medications, fish oil, and all vitamins.          Do not wear jewelry or makeup Do not wear lotions, powders, perfumes, or deodorant. Do not shave 48 hours prior to surgery.   Do not bring valuables to the hospital.  DO Not wear nail polish, gel polish, artificial nails, or any other type of covering on natural nails  including finger and toenails. If patients have artificial nails, gel coating, etc. that need to be removed by a nail salon please have this removed prior to surgery or surgery may need to be canceled/delayed if the  surgeon/ anesthesia feels like the patient is unable to be adequately monitored.             [facility] is not responsible for any belongings or valuables.  Do NOT Smoke (Tobacco/Vaping) or drink Alcohol 24 hours prior to your procedure If you use a CPAP at night, you may bring all equipment for your overnight stay.   Contacts, glasses, dentures or bridgework may not be worn into surgery, please bring cases for these belongings   For patients admitted to the hospital, discharge time will be determined by your treatment team.   Patients discharged the day of surgery will not be allowed to drive home, and someone needs to stay with them for 24 hours.  ONLY 1 SUPPORT PERSON MAY BE PRESENT WHILE YOU ARE IN SURGERY. IF YOU ARE TO BE ADMITTED ONCE YOU ARE IN YOUR ROOM YOU WILL BE ALLOWED TWO (2) VISITORS.  Minor children may have two parents present. Special consideration for safety and communication needs will be reviewed on a case by case basis.  Special instructions:    Oral Hygiene is also important to reduce your risk of infection.  Remember - BRUSH YOUR TEETH THE MORNING OF SURGERY WITH YOUR REGULAR TOOTHPASTE   [place]- Preparing For Surgery  Before surgery, you can play an important role. Because skin is not sterile, your skin needs to be as free of germs as possible. You can reduce the number of germs on  your skin by washing with CHG (chlorahexidine gluconate) Soap before surgery.  CHG is an antiseptic cleaner which kills germs and bonds with the skin to continue killing germs even after washing.     Please do not use if you have an allergy to CHG or antibacterial soaps. If your skin becomes reddened/irritated stop using the CHG.  Do not shave (including legs and underarms) for at least 48 hours prior to first CHG shower. It is OK to shave your face.  Please follow these instructions carefully.     Shower the NIGHT BEFORE SURGERY and the MORNING OF SURGERY with CHG Soap.   If  you chose to wash your hair, wash your hair first as usual with your normal shampoo. After you shampoo, rinse your hair and body thoroughly to remove the shampoo.  Then [employer] and genitals (private parts) with your normal soap and rinse thoroughly to remove soap.  After that Use CHG Soap as you would any other liquid soap. You can apply CHG directly to the skin and wash gently with a scrungie or a clean washcloth.   Apply the CHG Soap to your body ONLY FROM THE NECK DOWN.  Do not use on open wounds or open sores. Avoid contact with your eyes, ears, mouth and genitals (private parts). Wash Face and genitals (private parts)  with your normal soap.   Wash thoroughly, paying special attention to the area where your surgery will be performed.  Thoroughly rinse your body with warm water from the neck down.  DO NOT shower/wash with your normal soap after using and rinsing off the CHG Soap.  Pat yourself dry with a CLEAN TOWEL.  Wear CLEAN PAJAMAS to bed the night before surgery  Place CLEAN SHEETS on your bed the night before your surgery  DO NOT SLEEP WITH PETS.   Day of Surgery: Take a shower with CHG soap. Wear Clean/Comfortable clothing the morning of surgery Do not apply any deodorants/lotions.   Remember to brush your teeth WITH YOUR REGULAR TOOTHPASTE.   Please read over the following fact sheets that you were given.

## 2021-03-11 ENCOUNTER — Other Ambulatory Visit: Payer: Self-pay

## 2021-03-11 ENCOUNTER — Encounter (HOSPITAL_COMMUNITY): Payer: Self-pay

## 2021-03-11 ENCOUNTER — Encounter (HOSPITAL_COMMUNITY)
Admission: RE | Admit: 2021-03-11 | Discharge: 2021-03-11 | Disposition: A | Discharge status: Home / Self Care | Payer: Self-pay | Source: Ambulatory Visit | Attending: Orthopedic Surgery | Admitting: Orthopedic Surgery

## 2021-03-11 DIAGNOSIS — Z01812 Encounter for preprocedural laboratory examination: Secondary | ICD-10-CM | POA: Insufficient documentation

## 2021-03-11 HISTORY — DX: Essential (primary) hypertension: I10

## 2021-03-11 HISTORY — DX: Anxiety disorder, unspecified: F41.9

## 2021-03-11 HISTORY — DX: Unspecified osteoarthritis, unspecified site: M19.90

## 2021-03-11 LAB — BASIC METABOLIC PANEL
Anion gap: 6 (ref 5–15)
BUN: 18 mg/dL (ref 6–20)
CO2: 29 mmol/L (ref 22–32)
Calcium: 9 mg/dL (ref 8.9–10.3)
Chloride: 102 mmol/L (ref 98–111)
Creatinine, Ser: 0.94 mg/dL (ref 0.44–1.00)
GFR, Estimated: >60 mL/min (ref >60)
Glucose, Bld: 90 mg/dL (ref 70–99)
Potassium: 3.7 mmol/L (ref 3.5–5.1)
Sodium: 137 mmol/L (ref 135–145)

## 2021-03-11 LAB — CBC
HCT: 37.4 % (ref 36.0–46.0)
Hemoglobin: 12.1 g/dL (ref 12.0–15.0)
MCH: 28.7 pg (ref 26.0–34.0)
MCHC: 32.4 g/dL (ref 30.0–36.0)
MCV: 88.8 fL (ref 80.0–100.0)
Platelets: 248 10*3/uL (ref 150–400)
RBC: 4.21 MIL/uL (ref 3.87–5.11)
RDW: 13 % (ref 11.5–15.5)
WBC: 6.3 10*3/uL (ref 4.0–10.5)
nRBC: 0 % (ref 0.0–0.2)

## 2021-03-11 LAB — SURGICAL PCR SCREEN
MRSA, PCR: NEGATIVE
Staphylococcus aureus: POSITIVE — AB

## 2021-03-11 NOTE — Progress Notes (Signed)
PCP - Sofie Rower PA-C Cardiologist - denies  Chest x-ray - n/a EKG - 02/07/21 Stress Test - denies ECHO - 03/03/15 Cardiac Cath -03/02/15   Sleep Study - denies CPAP - denies  Blood Thinner Instructions: n/a Aspirin Instructions: n/a  ERAS Protcol - clear liquids until 0645 DOS. PRE-SURGERY Ensure or G2- pt provided with a pre-surgical ensure.  COVID TEST- Pt to go to [facility] on Tuesday, 03/12/21 for pre-op covid testing. Pt given directions, instructions, map and requisition form.   Anesthesia review: Yes, previous surgery cancelled on 02/13/21 for HBP readings. Pt stated that she followed up with PCP and was started on Olmesartan and Cymbalta. BP readings during PAT appointment, 160/95 at the start of appt and 157/95 by the end of appt.  Patient denies shortness of breath, fever, cough and chest pain at PAT appointment   All instructions explained to the patient, with a verbal understanding of the material. Patient agrees to go over the instructions while at home for a better understanding. Patient also instructed to self quarantine after being tested for COVID-19. The opportunity to ask questions was provided.

## 2021-03-12 ENCOUNTER — Other Ambulatory Visit (INDEPENDENT_AMBULATORY_CARE_PROVIDER_SITE_OTHER): Payer: Self-pay | Admitting: Orthopedic Surgery

## 2021-03-12 NOTE — Progress Notes (Signed)
Anesthesia Chart Review:  History of abnormal EKG with anterior T wave inversions notes at least 2016.  In 2016 he was worked up for an episode of chest pain.  He had a left heart cath which showed normal coronary arteries, normal LV function.  CT with slight pericardial thickening, pain resolved.  Patient described pain as a stabbing sensation at that time.  Patient was seen again in the emergency department April 2019 for episode of chest pain.  Cardiologist Dr. Harrington Challenger was consulted.  Per consult note 11/20/2017, "Pt is a 60 yo with history of CP in 2016    Cath normal. Presents with about 16 hours of CP now that is diffent en quality   Describes ad pressure, boring sensation through chest. ON exam:  Pain is intensified with pressing on sternum EKG without acute change   Trop neg   CT with scattered coronary and aortic calcifications  I do not think that pts symptoms represent angina  Probably musculoskeletal  May be related to GI illness with vomiting / wretching last weekend  I think it is OK to D/C pt  Rx NSAID (with GI prophylaxis) and Tramadol and rest  F/U with preimary MD"  Patient was previously scheduled for this procedure on 02/13/2021.  Procedure was canceled on DOS due to uncontrolled hypertension with systolics greater than [id number].  Reportedly was not taking antihypertensives at that time.  Subsequently, patient followed up with PCP Marella Bile, PA-C for management of hypertension.  Per note 02/20/2021, medications were refilled and he was cleared for surgery stating, "patient is medically cleared for left knee arthroplasty since BP is now controlled.  Recommend continuing current medications until 24 hours of surgery date if not surgically contraindicated."  Patient blood pressures noted to still be elevated at preadmission testing appointment on 03/11/2021, 157/95, however this is still significantly improved from prior.  Preop labs reviewed, WNL.  Anticipate he can proceed as planned barring  acute status change and hypertension remains reasonably well-controlled on day of surgery.  EKG 02/07/21: Sinus rhythm with Premature atrial complexes.  Rate 67. Incomplete right bundle branch block. T wave abnormality, consider anterior ischemia Prolonged QT. No significant change since last tracing   TTE 03/03/2015: - Left ventricle: The cavity size was normal. Wall thickness was    increased increased in a pattern of mild to moderate LVH.    Systolic function was normal. The estimated ejection fraction was    in the range of 55% to 60%. Wall motion was normal; there were no    regional wall motion abnormalities.  - Aortic root: The aortic root was mildly dilated.  - Left atrium: The atrium was mildly dilated.    Cardiac cath 03/02/2015: 1.         Normal coronary arteries 2.         Normal left ventricular function   Normal coronary arteries Normal left ventricular function Prolonged continuous chest pain, thickened per cardiac CT scan, and nondiagnostic EKG raises question of Pericarditis since pulmonary embolism and coronary disease have been excluded.   Recommendations: Further management per the primary team.   Karoline Caldwell, PA-C [facility] Short Stay Center/Anesthesiology Phone [phone] 03/12/2021 1:07 PM

## 2021-03-12 NOTE — Anesthesia Preprocedure Evaluation (Addendum)
Anesthesia Evaluation  Patient identified by MRN, date of birth, ID band Patient awake    Reviewed: Allergy & Precautions, NPO status , Patient's Chart, lab work & pertinent test results  Airway Mallampati: II  TM Distance: >3 FB Neck ROM: Full    Dental  (+) Dental Advisory Given   Pulmonary neg pulmonary ROS,    breath sounds clear to auscultation       Cardiovascular hypertension, Pt. on medications  Rhythm:Regular Rate:Normal     Neuro/Psych negative neurological ROS     GI/Hepatic negative GI ROS, Neg liver ROS,   Endo/Other  Morbid obesity  Renal/GU negative Renal ROS     Musculoskeletal  (+) Arthritis ,   Abdominal   Peds  Hematology negative hematology ROS (+)   Anesthesia Other Findings   Reproductive/Obstetrics                           Lab Results  Component Value Date   WBC 6.3 03/11/2021   HGB 12.1 03/11/2021   HCT 37.4 03/11/2021   MCV 88.8 03/11/2021   PLT 248 03/11/2021   Lab Results  Component Value Date   CREATININE 0.94 03/11/2021   BUN 18 03/11/2021   NA 137 03/11/2021   K 3.7 03/11/2021   CL 102 03/11/2021   CO2 29 03/11/2021    Anesthesia Physical Anesthesia Plan  ASA: 3  Anesthesia Plan: Spinal   Post-op Pain Management:  Regional for Post-op pain   Induction:   PONV Risk Score and Plan: 2 and Propofol infusion, Ondansetron and Treatment may vary due to age or medical condition  Airway Management Planned: Natural Airway and Simple Face Mask  Additional Equipment:   Intra-op Plan:   Post-operative Plan:   Informed Consent: I have reviewed the patients History and Physical, chart, labs and discussed the procedure including the risks, benefits and alternatives for the proposed anesthesia with the patient or authorized representative who has indicated his/her understanding and acceptance.     Dental advisory given  Plan Discussed with:  CRNA  Anesthesia Plan Comments: (PAT note by Karoline Caldwell, PA-C: History of abnormal EKG with anterior T wave inversions notes at least 2016. In 2016 he was worked up for an episode of chest pain. He had a left heart cath which showed normal coronary arteries, normal LV function. CT with slight pericardial thickening, pain resolved. Patient described pain as a stabbing sensation at that time. Patient was seen again in the emergency department April 2019 for episode of chest pain. Cardiologist Dr. Harrington Challenger was consulted. Per consult note 11/20/2017, "Pt is a 60 yo with history of CP in 2016 Cath normal. Presents with about 16 hours of CP now that is diffent en quality Describes ad pressure, boring sensation through chest.ON exam: Pain is intensified with pressing on sternum EKG without acute change Trop neg CT with scattered coronary and aortic calcifications I do not think that pts symptoms represent anginaProbably musculoskeletal May be related to GI illness with vomiting / wretching last weekend I think it is OK to D/C pt Rx NSAID (with GI prophylaxis) and Tramadol and rest F/U with preimary MD"  Patient was previously scheduled for this procedure on 02/13/2021.  Procedure was canceled on DOS due to uncontrolled hypertension with systolics greater than [id number].  Reportedly was not taking antihypertensives at that time.  Subsequently, patient followed up with PCP Marella Bile, PA-C for management of hypertension.  Per note 02/20/2021, medications were  refilled and he was cleared for surgery stating, "patient is medically cleared for left knee arthroplasty since BP is now controlled.  Recommend continuing current medications until 24 hours of surgery date if not surgically contraindicated."  Patient blood pressures noted to still be elevated at preadmission testing appointment on 03/11/2021, 157/95, however this is still significantly improved from prior.  Preop labs reviewed, WNL.   Anticipate he can proceed as planned barring acute status change and hypertension remains reasonably well-controlled on day of surgery.  EKG 02/07/21:Sinus rhythm with Premature atrial complexes. Rate 67. Incomplete right bundle branch block.T wave abnormality, consider anterior ischemia Prolonged QT.No significant change since last tracing  TTE 03/03/2015: - Left ventricle: The cavity size was normal. Wall thickness was  increased increased in a pattern of mild to moderate LVH.  Systolic function was normal. The estimated ejection fraction was  in the range of 55% to 60%. Wall motion was normal; there were no  regional wall motion abnormalities.  - Aortic root: The aortic root was mildly dilated.  - Left atrium: The atrium was mildly dilated.   Cardiac cath 03/02/2015: 1.Normal coronary arteries 2.Normal left ventricular function  Normal coronary arteries Normal left ventricular function Prolonged continuous chest pain, thickened per cardiac CT scan, and nondiagnostic EKG raises question of Pericarditis since pulmonary embolism and coronary disease have been excluded.  Recommendations: Further management per the primary team.   )      Anesthesia Quick Evaluation

## 2021-03-13 LAB — SARS CORONAVIRUS 2 (TAT 6-24 HRS): SARS Coronavirus 2: NEGATIVE

## 2021-03-15 ENCOUNTER — Ambulatory Visit (HOSPITAL_COMMUNITY): Payer: Self-pay | Admitting: Physician Assistant

## 2021-03-15 ENCOUNTER — Other Ambulatory Visit: Payer: Self-pay

## 2021-03-15 ENCOUNTER — Ambulatory Visit (HOSPITAL_COMMUNITY): Payer: Self-pay | Admitting: Anesthesiology

## 2021-03-15 ENCOUNTER — Encounter (HOSPITAL_COMMUNITY): Payer: Self-pay | Admitting: Orthopedic Surgery

## 2021-03-15 ENCOUNTER — Observation Stay (HOSPITAL_COMMUNITY)
Admission: RE | Admit: 2021-03-15 | Discharge: 2021-03-16 | Disposition: A | Discharge status: Home / Self Care | Payer: Self-pay | Attending: Orthopedic Surgery | Admitting: Orthopedic Surgery

## 2021-03-15 ENCOUNTER — Encounter (HOSPITAL_COMMUNITY)
Admission: RE | Disposition: A | Discharge status: Home / Self Care | Payer: Self-pay | Source: Home / Self Care | Attending: Orthopedic Surgery

## 2021-03-15 DIAGNOSIS — M179 Osteoarthritis of knee, unspecified: Principal | ICD-10-CM | POA: Insufficient documentation

## 2021-03-15 DIAGNOSIS — M1712 Unilateral primary osteoarthritis, left knee: Secondary | ICD-10-CM | POA: Diagnosis present

## 2021-03-15 HISTORY — PX: TOTAL KNEE ARTHROPLASTY: SHX125

## 2021-03-15 HISTORY — PX: APPLICATION OF WOUND VAC: SHX5189

## 2021-03-15 SURGERY — ARTHROPLASTY, KNEE, TOTAL
Anesthesia: Spinal | Site: Knee | Laterality: Left

## 2021-03-15 MED ORDER — ONDANSETRON HCL 4 MG PO TABS
4.0000 mg | ORAL_TABLET | Freq: Four times a day (QID) | ORAL | Status: DC | PRN
  Filled 2021-03-15: qty 1

## 2021-03-15 MED ORDER — PHENOL 1.4 % MT LIQD: 1.0000 | OROMUCOSAL | Status: DC | PRN

## 2021-03-15 MED ORDER — MIDAZOLAM HCL 2 MG/2ML IJ SOLN
INTRAMUSCULAR | Status: AC
  Filled 2021-03-15: qty 2

## 2021-03-15 MED ORDER — TRANEXAMIC ACID 1000 MG/10ML IV SOLN
2000.0000 mg | Freq: Once | INTRAVENOUS | Status: AC
  Filled 2021-03-15: qty 20

## 2021-03-15 MED ORDER — LACTATED RINGERS IV SOLN: INTRAVENOUS | Status: DC

## 2021-03-15 MED ORDER — 0.9 % SODIUM CHLORIDE (POUR BTL) OPTIME
TOPICAL | Status: DC | PRN
  Administered 2021-03-15: 1000 mL

## 2021-03-15 MED ORDER — ORAL CARE MOUTH RINSE: 15.0000 mL | Freq: Once | OROMUCOSAL | Status: AC

## 2021-03-15 MED ORDER — TRANEXAMIC ACID-NACL 1000-0.7 MG/100ML-% IV SOLN
1000.0000 mg | Freq: Once | INTRAVENOUS | Status: AC
  Administered 2021-03-15: 1000 mg via INTRAVENOUS
  Filled 2021-03-15: qty 100

## 2021-03-15 MED ORDER — GABAPENTIN 300 MG PO CAPS
300.0000 mg | ORAL_CAPSULE | Freq: Two times a day (BID) | ORAL | Status: DC
  Administered 2021-03-15 – 2021-03-16 (×2): 300 mg via ORAL
  Filled 2021-03-15 (×2): qty 1

## 2021-03-15 MED ORDER — FENTANYL CITRATE (PF) 100 MCG/2ML IJ SOLN
INTRAMUSCULAR | Status: AC
  Administered 2021-03-15: 50 ug via INTRAVENOUS
  Filled 2021-03-15: qty 2

## 2021-03-15 MED ORDER — MIDAZOLAM HCL 2 MG/2ML IJ SOLN: 1.0000 mg | Freq: Once | INTRAMUSCULAR | Status: AC

## 2021-03-15 MED ORDER — ASPIRIN EC 325 MG PO TBEC
325.0000 mg | DELAYED_RELEASE_TABLET | Freq: Every day | ORAL | Status: DC
  Administered 2021-03-16: 325 mg via ORAL
  Filled 2021-03-15: qty 1

## 2021-03-15 MED ORDER — ONDANSETRON HCL 4 MG/2ML IJ SOLN
4.0000 mg | Freq: Four times a day (QID) | INTRAMUSCULAR | Status: DC | PRN
  Filled 2021-03-15: qty 2

## 2021-03-15 MED ORDER — IRBESARTAN 75 MG PO TABS
37.5000 mg | ORAL_TABLET | Freq: Every day | ORAL | Status: DC
  Administered 2021-03-15: 37.5 mg via ORAL
  Filled 2021-03-15 (×2): qty 0.5

## 2021-03-15 MED ORDER — CEFAZOLIN SODIUM-DEXTROSE 2-4 GM/100ML-% IV SOLN
2.0000 g | Freq: Four times a day (QID) | INTRAVENOUS | Status: AC
  Administered 2021-03-15 (×2): 2 g via INTRAVENOUS
  Filled 2021-03-15: qty 100

## 2021-03-15 MED ORDER — FENTANYL CITRATE (PF) 250 MCG/5ML IJ SOLN
INTRAMUSCULAR | Status: AC
  Filled 2021-03-15: qty 5

## 2021-03-15 MED ORDER — ROPIVACAINE HCL 5 MG/ML IJ SOLN
INTRAMUSCULAR | Status: DC | PRN
  Administered 2021-03-15: 20 mL via PERINEURAL

## 2021-03-15 MED ORDER — FENTANYL CITRATE (PF) 100 MCG/2ML IJ SOLN
25.0000 ug | INTRAMUSCULAR | Status: DC | PRN
  Administered 2021-03-15: 25 ug via INTRAVENOUS
  Administered 2021-03-15: 50 ug via INTRAVENOUS
  Administered 2021-03-15 (×3): 25 ug via INTRAVENOUS

## 2021-03-15 MED ORDER — SODIUM CHLORIDE 0.9 % IV SOLN: INTRAVENOUS | Status: DC

## 2021-03-15 MED ORDER — DOCUSATE SODIUM 100 MG PO CAPS
100.0000 mg | ORAL_CAPSULE | Freq: Two times a day (BID) | ORAL | Status: DC
  Administered 2021-03-15 – 2021-03-16 (×3): 100 mg via ORAL
  Filled 2021-03-15 (×3): qty 1

## 2021-03-15 MED ORDER — CEFAZOLIN IN SODIUM CHLORIDE 3-0.9 GM/100ML-% IV SOLN
3.0000 g | INTRAVENOUS | Status: AC
  Administered 2021-03-15: 3 g via INTRAVENOUS
  Filled 2021-03-15: qty 100

## 2021-03-15 MED ORDER — TRANEXAMIC ACID-NACL 1000-0.7 MG/100ML-% IV SOLN
INTRAVENOUS | Status: DC | PRN
  Administered 2021-03-15: 1000 mg via INTRAVENOUS

## 2021-03-15 MED ORDER — OXYCODONE HCL 5 MG PO TABS
5.0000 mg | ORAL_TABLET | ORAL | Status: DC | PRN
  Administered 2021-03-15 – 2021-03-16 (×5): 10 mg via ORAL
  Filled 2021-03-15 (×5): qty 2

## 2021-03-15 MED ORDER — KETOROLAC TROMETHAMINE 15 MG/ML IJ SOLN
15.0000 mg | Freq: Four times a day (QID) | INTRAMUSCULAR | Status: AC
  Administered 2021-03-15 – 2021-03-16 (×4): 15 mg via INTRAVENOUS
  Filled 2021-03-15 (×2): qty 1

## 2021-03-15 MED ORDER — ONDANSETRON HCL 4 MG/2ML IJ SOLN
INTRAMUSCULAR | Status: AC
  Filled 2021-03-15: qty 2

## 2021-03-15 MED ORDER — HYDROMORPHONE HCL 1 MG/ML IJ SOLN: 0.5000 mg | INTRAMUSCULAR | Status: DC | PRN

## 2021-03-15 MED ORDER — BUPIVACAINE IN DEXTROSE 0.75-8.25 % IT SOLN
INTRATHECAL | Status: DC | PRN
  Administered 2021-03-15: 1.6 mL via INTRATHECAL

## 2021-03-15 MED ORDER — PROPOFOL 10 MG/ML IV BOLUS
INTRAVENOUS | Status: DC | PRN
  Administered 2021-03-15 (×2): 10 mg via INTRAVENOUS

## 2021-03-15 MED ORDER — FENTANYL CITRATE (PF) 100 MCG/2ML IJ SOLN
INTRAMUSCULAR | Status: DC | PRN
  Administered 2021-03-15: 25 ug via INTRAVENOUS

## 2021-03-15 MED ORDER — PROPOFOL 500 MG/50ML IV EMUL
INTRAVENOUS | Status: DC | PRN
  Administered 2021-03-15: 75 ug/kg/min via INTRAVENOUS

## 2021-03-15 MED ORDER — ACETAMINOPHEN 325 MG PO TABS
325.0000 mg | ORAL_TABLET | Freq: Four times a day (QID) | ORAL | Status: DC | PRN
  Administered 2021-03-16: 650 mg via ORAL
  Filled 2021-03-15: qty 2

## 2021-03-15 MED ORDER — PROPOFOL 10 MG/ML IV BOLUS
INTRAVENOUS | Status: AC
  Filled 2021-03-15: qty 20

## 2021-03-15 MED ORDER — LIDOCAINE 2% (20 MG/ML) 5 ML SYRINGE
INTRAMUSCULAR | Status: AC
  Filled 2021-03-15: qty 5

## 2021-03-15 MED ORDER — MENTHOL 3 MG MT LOZG: 1.0000 | LOZENGE | OROMUCOSAL | Status: DC | PRN

## 2021-03-15 MED ORDER — CEFAZOLIN SODIUM-DEXTROSE 2-4 GM/100ML-% IV SOLN
INTRAVENOUS | Status: AC
  Filled 2021-03-15: qty 100

## 2021-03-15 MED ORDER — DEXAMETHASONE SODIUM PHOSPHATE 10 MG/ML IJ SOLN
INTRAMUSCULAR | Status: AC
  Filled 2021-03-15: qty 1

## 2021-03-15 MED ORDER — METOCLOPRAMIDE HCL 5 MG PO TABS
5.0000 mg | ORAL_TABLET | Freq: Three times a day (TID) | ORAL | Status: DC | PRN
  Filled 2021-03-15: qty 2

## 2021-03-15 MED ORDER — AMISULPRIDE (ANTIEMETIC) 5 MG/2ML IV SOLN: 10.0000 mg | Freq: Once | INTRAVENOUS | Status: DC | PRN

## 2021-03-15 MED ORDER — FENTANYL CITRATE (PF) 100 MCG/2ML IJ SOLN: 50.0000 ug | Freq: Once | INTRAMUSCULAR | Status: AC

## 2021-03-15 MED ORDER — CHLORHEXIDINE GLUCONATE 0.12 % MT SOLN
15.0000 mL | Freq: Once | OROMUCOSAL | Status: AC
  Administered 2021-03-15: 15 mL via OROMUCOSAL
  Filled 2021-03-15: qty 15

## 2021-03-15 MED ORDER — FENTANYL CITRATE (PF) 100 MCG/2ML IJ SOLN
INTRAMUSCULAR | Status: AC
  Filled 2021-03-15: qty 2

## 2021-03-15 MED ORDER — MIDAZOLAM HCL 2 MG/2ML IJ SOLN
INTRAMUSCULAR | Status: AC
  Administered 2021-03-15: 1 mg via INTRAVENOUS
  Filled 2021-03-15: qty 2

## 2021-03-15 MED ORDER — KETOROLAC TROMETHAMINE 15 MG/ML IJ SOLN
INTRAMUSCULAR | Status: AC
  Filled 2021-03-15: qty 1

## 2021-03-15 MED ORDER — SODIUM CHLORIDE 0.9 % IR SOLN
Status: DC | PRN
  Administered 2021-03-15: 3000 mL

## 2021-03-15 MED ORDER — DULOXETINE HCL 60 MG PO CPEP
60.0000 mg | ORAL_CAPSULE | Freq: Every day | ORAL | Status: DC
  Administered 2021-03-16: 60 mg via ORAL
  Filled 2021-03-15: qty 1
  Filled 2021-03-15: qty 2

## 2021-03-15 MED ORDER — TRANEXAMIC ACID-NACL 1000-0.7 MG/100ML-% IV SOLN
INTRAVENOUS | Status: AC
  Filled 2021-03-15: qty 100

## 2021-03-15 MED ORDER — PHENYLEPHRINE HCL-NACL 20-0.9 MG/250ML-% IV SOLN
INTRAVENOUS | Status: DC | PRN
  Administered 2021-03-15: 25 ug/min via INTRAVENOUS

## 2021-03-15 MED ORDER — ACETAMINOPHEN 500 MG PO TABS
1000.0000 mg | ORAL_TABLET | Freq: Once | ORAL | Status: AC
  Administered 2021-03-15: 1000 mg via ORAL
  Filled 2021-03-15: qty 2

## 2021-03-15 MED ORDER — ONDANSETRON HCL 4 MG/2ML IJ SOLN
INTRAMUSCULAR | Status: DC | PRN
  Administered 2021-03-15: 4 mg via INTRAVENOUS

## 2021-03-15 MED ORDER — METOCLOPRAMIDE HCL 5 MG/ML IJ SOLN
5.0000 mg | Freq: Three times a day (TID) | INTRAMUSCULAR | Status: DC | PRN
  Filled 2021-03-15: qty 2

## 2021-03-15 MED ORDER — TRANEXAMIC ACID 1000 MG/10ML IV SOLN
INTRAVENOUS | Status: DC | PRN
  Administered 2021-03-15: 2000 mg via TOPICAL

## 2021-03-15 SURGICAL SUPPLY — 60 items
BAG COUNTER SPONGE SURGICOUNT (BAG) ×3 IMPLANT
BAG SPNG CNTER NS LX DISP (BAG) ×2
BLADE SAGITTAL 25.0X1.19X90 (BLADE) ×3 IMPLANT
BLADE SAW SGTL 13X75X1.27 (BLADE) ×3 IMPLANT
BNDG COHESIVE 6X5 TAN ST LF (GAUZE/BANDAGES/DRESSINGS) ×1 IMPLANT
BNDG GAUZE ELAST 4 BULKY (GAUZE/BANDAGES/DRESSINGS) ×3 IMPLANT
BOWL SMART MIX CTS (DISPOSABLE) ×3 IMPLANT
BSPLAT TIB 5D F CMNT KN LT (Knees) ×2 IMPLANT
CANISTER WOUNDNEG PRESSURE 500 (CANNISTER) ×1 IMPLANT
CEMENT BONE R 1X40 (Cement) ×6 IMPLANT
COMP FEM CEMT PERSONA STD SZ7 (Knees) ×3 IMPLANT
COMPONENT FEM CMT PRSN STD SZ7 (Knees) IMPLANT
COOLER ICEMAN CLASSIC (MISCELLANEOUS) ×4 IMPLANT
COVER SURGICAL LIGHT HANDLE (MISCELLANEOUS) ×3 IMPLANT
CUFF TOURN SGL QUICK 42 (TOURNIQUET CUFF) ×1 IMPLANT
DRAPE EXTREMITY T 121X128X90 (DISPOSABLE) ×3 IMPLANT
DRAPE HALF SHEET 40X57 (DRAPES) ×6 IMPLANT
DRAPE U-SHAPE 47X51 STRL (DRAPES) ×3 IMPLANT
DRSG ADAPTIC 3X8 NADH LF (GAUZE/BANDAGES/DRESSINGS) ×3 IMPLANT
DRSG DERMACEA 8X12 NADH (GAUZE/BANDAGES/DRESSINGS) ×3 IMPLANT
DRSG PAD ABDOMINAL 8X10 ST (GAUZE/BANDAGES/DRESSINGS) ×3 IMPLANT
DURAPREP 26ML APPLICATOR (WOUND CARE) ×3 IMPLANT
ELECT REM PT RETURN 9FT ADLT (ELECTROSURGICAL) ×3
ELECTRODE REM PT RTRN 9FT ADLT (ELECTROSURGICAL) ×2 IMPLANT
FACESHIELD WRAPAROUND (MASK) ×3 IMPLANT
FACESHIELD WRAPAROUND OR TEAM (MASK) ×2 IMPLANT
GAUZE SPONGE 4X4 12PLY STRL (GAUZE/BANDAGES/DRESSINGS) ×3 IMPLANT
GLOVE SURG ORTHO LTX SZ9 (GLOVE) ×3 IMPLANT
GLOVE SURG UNDER POLY LF SZ9 (GLOVE) ×3 IMPLANT
GOWN STRL REUS W/ TWL XL LVL3 (GOWN DISPOSABLE) ×4 IMPLANT
GOWN STRL REUS W/TWL XL LVL3 (GOWN DISPOSABLE) ×6
HANDPIECE INTERPULSE COAX TIP (DISPOSABLE) ×3
HDLS TROCR DRIL PIN KNEE 75 (PIN) ×3
KIT BASIN OR (CUSTOM PROCEDURE TRAY) ×3 IMPLANT
KIT TURNOVER KIT B (KITS) ×3 IMPLANT
MANIFOLD NEPTUNE II (INSTRUMENTS) ×3 IMPLANT
NS IRRIG 1000ML POUR BTL (IV SOLUTION) ×3 IMPLANT
PACK TOTAL JOINT (CUSTOM PROCEDURE TRAY) ×3 IMPLANT
PAD ARMBOARD 7.5X6 YLW CONV (MISCELLANEOUS) ×3 IMPLANT
PAD COLD SHLDR WRAP-ON (PAD) ×3 IMPLANT
PIN DRILL HDLS TROCAR 75 4PK (PIN) IMPLANT
PREVENA RESTOR AXIOFORM 29X28 (GAUZE/BANDAGES/DRESSINGS) ×1 IMPLANT
SCREW FEMALE HEX FIX 25X2.5 (ORTHOPEDIC DISPOSABLE SUPPLIES) ×1 IMPLANT
SET HNDPC FAN SPRY TIP SCT (DISPOSABLE) ×2 IMPLANT
STAPLER VISISTAT 35W (STAPLE) ×3 IMPLANT
STEM MED AS PERS SZ6-7 11 (Stem) ×1 IMPLANT
STEM POLY PAT PLY 35M KNEE (Knees) ×1 IMPLANT
STEM TIBIA 5 DEG SZ F L KNEE (Knees) IMPLANT
SUCTION FRAZIER HANDLE 10FR (MISCELLANEOUS) ×3
SUCTION TUBE FRAZIER 10FR DISP (MISCELLANEOUS) IMPLANT
SUT VIC AB 0 CT1 27 (SUTURE) ×6
SUT VIC AB 0 CT1 27XBRD ANBCTR (SUTURE) ×2 IMPLANT
SUT VIC AB 1 CTX 36 (SUTURE) ×6
SUT VIC AB 1 CTX36XBRD ANBCTR (SUTURE) IMPLANT
SUT VIC AB 2-0 CT1 27 (SUTURE) ×6
SUT VIC AB 2-0 CT1 TAPERPNT 27 (SUTURE) IMPLANT
TIBIA STEM 5 DEG SZ F L KNEE (Knees) ×3 IMPLANT
TOWEL GREEN STERILE (TOWEL DISPOSABLE) ×3 IMPLANT
TOWEL GREEN STERILE FF (TOWEL DISPOSABLE) ×3 IMPLANT
YANKAUER SUCT BULB TIP NO VENT (SUCTIONS) ×1 IMPLANT

## 2021-03-15 NOTE — Op Note (Signed)
DATE OF SURGERY:  03/15/2021  TIME: 1:46 PM  PATIENT NAME:  Jenna Mccann    AGE: 60 y.o.    PRE-OPERATIVE DIAGNOSIS:  Osteoarthritis Left Knee  POST-OPERATIVE DIAGNOSIS:  Osteoarthritis Left Knee  PROCEDURE:  Procedure(s): LEFT TOTAL KNEE ARTHROPLASTY APPLICATION OF WOUND VAC  SURGEON: Meridee Score  ASSISTANT: April green  OPERATIVE IMPLANTS: Depuy , Posterior Stabilized.  Femur size 7, Tibia size F, Patella size 35 3-peg oval button, with a 11 mm polyethylene insert.  '@ENCIMAGES'$ @    PREOPERATIVE INDICATIONS:   Jenna Mccann is a 60 y.o. year old female with end stage degenerative arthritis of the knee who failed conservative treatment and elected for Total Knee Arthroplasty.   The risks, benefits, and alternatives were discussed at length including but not limited to the risks of infection, bleeding, nerve injury, stiffness, blood clots, the need for revision surgery, cardiopulmonary complications, among others, and they were willing to proceed.  OPERATIVE DESCRIPTION:  The patient was brought to the operative room and placed in a supine position.  General anesthesia was administered.  IV antibiotics were given.  The lower extremity was prepped and draped in the usual sterile fashion.  Jenna Mccann was used to cover all exposed skin. Time out was performed.    Anterior quadriceps tendon splitting approach was performed.  The patella was everted and osteophytes were removed.  The anterior horn of the medial and lateral meniscus was removed.   The distal femur was opened with the drill and the intramedullary distal femoral cutting jig was utilized, set at 5 degrees valgus resecting 9 mm off the distal femur.  Care was taken to protect the collateral ligaments.  Then the extramedullary tibial cutting jig was utilized set for 3 degree posterior slope.  Care was taken during the cut to protect the medial and collateral ligaments.  The proximal tibia was removed along with the  posterior horns of the menisci.  The PCL was sacrificed.    The extensor gap was measured and was approximately 11 mm.    The distal femoral sizing jig was applied, taking care to avoid notching.  Then the 4-in-1 cutting jig was applied and the anterior and posterior femur was cut, along with the chamfer cuts.  All posterior osteophytes were removed.  The flexion gap was then measured and was symmetric with the extension gap.  The distal femoral preparation using the appropriate jig to prepare the box.  The patella was then measured, and cut with the saw.    The proximal tibia sized and prepared accordingly with the reamer and the punch, and then all components were trialed with the poly insert.  The knee was found to have stable balance and full motion.  The knee was irrigated with normal saline and the knee was soaked with TXA.  The above named components were then cemented into place and all excess cement was removed.  The final polyethylene component was in place during cementation.  The knee was kept in extension until the cement hardened.  The knee was then taken through a range of motion and the patella tracked well and the knee irrigated copiously and the parapatellar and subcutaneous tissue closed with vicryl, and skin closed with staples..  A sterile dressing was applied and patient  was taken to the PACU in stable  condition.  There were no complications.  Total tourniquet time was 39 minutes.

## 2021-03-15 NOTE — Interval H&P Note (Signed)
History and Physical Interval Note:  03/15/2021 6:50 AM  Jenna Mccann  has presented today for surgery, with the diagnosis of Osteoarthritis Left Knee.  The various methods of treatment have been discussed with the patient and family. After consideration of risks, benefits and other options for treatment, the patient has consented to  Procedure(s): LEFT TOTAL KNEE ARTHROPLASTY (Left) as a surgical intervention.  The patient's history has been reviewed, patient examined, no change in status, stable for surgery.  I have reviewed the patient's chart and labs.  Questions were answered to the patient's satisfaction.     [name]

## 2021-03-15 NOTE — H&P (Signed)
TOTAL KNEE ADMISSION H&P  Patient is being admitted for left total knee arthroplasty.  Subjective:  Chief Complaint:left knee pain.  HPI: Jenna Mccann, 60 y.o. female, has a history of pain and functional disability in the left knee due to arthritis and has failed non-surgical conservative treatments for greater than 12 weeks to includeNSAID's and/or analgesics and corticosteriod injections.  Onset of symptoms was gradual, starting 4 years ago with gradually worsening course since that time. The patient noted no past surgery on the left knee(s).  Patient currently rates pain in the left knee(s) at 4 out of 10 with activity. Patient has night pain and worsening of pain with activity and weight bearing.  Patient has evidence of subchondral cysts, subchondral sclerosis, and joint space narrowing by imaging studies. This patient has had    . There is no active infection.  Patient Active Problem List   Diagnosis Date Noted   Acute pericarditis 03/04/2015   Morbid obesity ([place]) 03/04/2015   Normocytic anemia 03/04/2015   Abnormal EKG 03/02/2015   Chest pain    Frequent UTI 07/05/2014   Incomplete emptying of bladder 07/05/2014   Dyslipidemia 06/29/2014   Tubular adenoma of colon 05/09/2014   Colon, diverticulosis 05/06/2014   Diverticulosis of large intestine without hemorrhage 05/06/2014   Morbid obesity with BMI of 40.0-44.9, adult ([place]) 03/27/2014   Past Medical History:  Diagnosis Date   Anxiety    Arthritis    Diverticulitis    History of cardiac catheterization    LHC during admit for pericarditis 8/16:  normal coronary arteries   History of echocardiogram    a. Echo 03/03/15:  mild to mod LVH, EF 55-60%, no RWMA, mildly dilated aortic root, mild LAE   Hypertension    Normocytic anemia    Pericarditis 02/26/2015    Past Surgical History:  Procedure Laterality Date   ABDOMINAL HYSTERECTOMY     BACK SURGERY     CARDIAC CATHETERIZATION N/A 03/02/2015   Procedure: Left Heart  Cath and Coronary Angiography;  Surgeon: Belva Crome, MD;  Location: [facility];  Service: Cardiovascular;  Laterality: N/A;   CHOLECYSTECTOMY     ROTATOR CUFF REPAIR Right     No current facility-administered medications for this encounter.   No Known Allergies  Social History   Tobacco Use   Smoking status: Never   Smokeless tobacco: Never  Substance Use Topics   Alcohol use: No    No family history on file.   Review of Systems  All other systems reviewed and are negative.  Objective:  Physical Exam Patient is alert, oriented, no adenopathy, well-dressed, normal affect, normal respiratory effort. Left knee no effusion no cellulitis she is tender over the lateral joint line and over the patellofemoral joint.  She has valgus alignment lungs clear bilaterally heart regular rate and rhythm Vital signs in last 24 hours:    Labs:   Estimated body mass index is 42.05 kg/m as calculated from the following:   Height as of 03/11/21: '5\' 8"'$  (1.727 m).   Weight as of 03/11/21: 125.4 kg.   Imaging Review Plain radiographs demonstrate moderate degenerative joint disease of the left knee(s). The overall alignment isneutral. The bone quality appears to be good for age and reported activity level.      Assessment/Plan:  End stage arthritis, left knee   The patient history, physical examination, clinical judgment of the provider and imaging studies are consistent with end stage degenerative joint disease of the left  knee(s) and total knee arthroplasty is deemed medically necessary. The treatment options including medical management, injection therapy arthroscopy and arthroplasty were discussed at length. The risks and benefits of total knee arthroplasty were presented and reviewed. The risks due to aseptic loosening, infection, stiffness, patella tracking problems, thromboembolic complications and other imponderables were discussed. The patient acknowledged the explanation,  agreed to proceed with the plan and consent was signed. Patient is being admitted for inpatient treatment for surgery, pain control, PT, OT, prophylactic antibiotics, VTE prophylaxis, progressive ambulation and ADL's and discharge planning. The patient is planning to be discharged home with home health services     Patient's anticipated LOS is less than 2 midnights, meeting these requirements: - Younger than 29 - Lives within 1 hour of care - Has a competent adult at home to recover with post-op recover - NO history of  - Chronic pain requiring opiods  - Diabetes  - Coronary Artery Disease  - Heart failure  - Heart attack  - Stroke  - DVT/VTE  - Cardiac arrhythmia  - Respiratory Failure/COPD  - Renal failure  - Anemia  - Advanced Liver disease

## 2021-03-15 NOTE — Transfer of Care (Signed)
Immediate Anesthesia Transfer of Care Note  Patient: Nayali Talerico Supple  Procedure(s) Performed: LEFT TOTAL KNEE ARTHROPLASTY (Left: Knee)  Patient Location: PACU  Anesthesia Type:Spinal and MAC combined with regional for post-op pain  Level of Consciousness: oriented, drowsy, patient cooperative and responds to stimulation  Airway & Oxygen Therapy: Patient Spontanous Breathing  Post-op Assessment: Report given to RN and Post -op Vital signs reviewed and stable  Post vital signs: Reviewed and stable  Last Vitals:  Vitals Value Taken Time  BP 104/71 03/15/21 1125  Temp 36.2 C 03/15/21 1125  Pulse 58 03/15/21 1132  Resp 16 03/15/21 1132  SpO2 97 % 03/15/21 1132  Vitals shown include unvalidated device data.  Last Pain:  Vitals:   03/15/21 1125  TempSrc:   PainSc: 0-No pain      Patients Stated Pain Goal: 3 (24/23/53 6144)  Complications: No notable events documented.

## 2021-03-15 NOTE — Evaluation (Signed)
Physical Therapy Evaluation Patient Details Name: Jenna Mccann MRN: [id number] DOB: 08-31-1960 Today's Date: 03/15/2021   History of Present Illness  Pt is a 60 y/o female s/p L TKA. PMH includes back surgery.  Clinical Impression  Pt admitted secondary to problem above with deficits below. Pt very limited secondary to pain and only able to tolerate sitting EOB. Requiring min to mod A for bed mobility. Educated about knee precautions. Will continue to follow acutely.     Follow Up Recommendations Follow surgeon's recommendation for DC plan and follow-up therapies    Equipment Recommendations  Rolling walker with 5" wheels;3in1 (PT) (bariatric RW and 3 in 1)    Recommendations for Other Services       Precautions / Restrictions Precautions Precautions: Knee Precaution Booklet Issued: No Precaution Comments: Verbally reviewed knee precautions. Restrictions Weight Bearing Restrictions: Yes LLE Weight Bearing: Weight bearing as tolerated      Mobility  Bed Mobility Overal bed mobility: Needs Assistance Bed Mobility: Supine to Sit;Sit to Supine     Supine to sit: Mod assist Sit to supine: Min assist   General bed mobility comments: Mod A for trunk and LLE assist to come to sitting. Pt with increased pain in sitting and unable to tolerate further mobility. Min A for LLE assist to return to supine.    Transfers                    Ambulation/Gait                Stairs            Wheelchair Mobility    Modified Rankin (Stroke Patients Only)       Balance Overall balance assessment: Needs assistance Sitting-balance support: No upper extremity supported;Feet supported Sitting balance-Leahy Scale: Fair                                       Pertinent Vitals/Pain Pain Assessment: 0-10 Pain Score: 6  Pain Location: L knee Pain Descriptors / Indicators: Aching;Operative site guarding Pain Intervention(s): Monitored during  session;Limited activity within patient's tolerance;Repositioned    Home Living Family/patient expects to be discharged to:: Private residence Living Arrangements: Spouse/significant other Available Help at Discharge: Family;Available 24 hours/day Type of Home: House Home Access: Level entry     Home Layout: One level Home Equipment: Wheelchair - manual      Prior Function Level of Independence: Independent               Hand Dominance        Extremity/Trunk Assessment   Upper Extremity Assessment Upper Extremity Assessment: Overall WFL for tasks assessed    Lower Extremity Assessment Lower Extremity Assessment: LLE deficits/detail LLE Deficits / Details: Deficits consistent with post op pain and weakness.    Cervical / Trunk Assessment Cervical / Trunk Assessment: Normal  Communication   Communication: No difficulties  Cognition Arousal/Alertness: Awake/alert Behavior During Therapy: WFL for tasks assessed/performed Overall Cognitive Status: Within Functional Limits for tasks assessed                                        General Comments      Exercises     Assessment/Plan    PT Assessment Patient needs continued PT services  PT Problem  List Decreased strength;Decreased range of motion;Decreased activity tolerance;Decreased balance;Decreased mobility;Decreased knowledge of use of DME;Decreased knowledge of precautions;Pain       PT Treatment Interventions DME instruction;Gait training;Functional mobility training;Therapeutic activities;Therapeutic exercise;Balance training;Patient/family education    PT Goals (Current goals can be found in the Care Plan section)  Acute Rehab PT Goals Patient Stated Goal: to decrease pain and be able to go out to eat again PT Goal Formulation: With patient Time For Goal Achievement: 03/29/21 Potential to Achieve Goals: Fair    Frequency 7X/week   Barriers to discharge        Co-evaluation                AM-PAC PT "6 Clicks" Mobility  Outcome Measure Help needed turning from your back to your side while in a flat bed without using bedrails?: A Little Help needed moving from lying on your back to sitting on the side of a flat bed without using bedrails?: A Lot Help needed moving to and from a bed to a chair (including a wheelchair)?: A Lot Help needed standing up from a chair using your arms (e.g., wheelchair or bedside chair)?: A Lot Help needed to walk in hospital room?: A Lot Help needed climbing 3-5 steps with a railing? : A Lot 6 Click Score: 13    End of Session   Activity Tolerance: Patient limited by pain Patient left: in bed;with call bell/phone within reach (in bed in PACU) Nurse Communication: Mobility status PT Visit Diagnosis: Other abnormalities of gait and mobility (R26.89);Pain Pain - Right/Left: Left Pain - part of body: Knee    Time: [id number] PT Time Calculation (min) (ACUTE ONLY): 13 min   Charges:   PT Evaluation $PT Eval Low Complexity: 1 Low          Lou Miner, DPT  Acute Rehabilitation Services  Pager: [phone] Office: [phone]   Rudean Hitt 03/15/2021, 4:07 PM

## 2021-03-15 NOTE — Anesthesia Procedure Notes (Signed)
Anesthesia Regional Block: Adductor canal block   Pre-Anesthetic Checklist: , timeout performed,  Correct Patient, Correct Site, Correct Laterality,  Correct Procedure, Correct Position, site marked,  Risks and benefits discussed,  Surgical consent,  Pre-op evaluation,  At surgeon's request and post-op pain management  Laterality: Left  Prep: chloraprep       Needles:  Injection technique: Single-shot  Needle Type: Echogenic Needle     Needle Length: 9cm  Needle Gauge: 21     Additional Needles:   Procedures:,,,, ultrasound used (permanent image in chart),,    Narrative:  Start time: 03/15/2021 7:15 AM End time: 03/15/2021 7:20 AM Injection made incrementally with aspirations every 5 mL.  Performed by: Personally  Anesthesiologist: Suzette Battiest, MD

## 2021-03-15 NOTE — Anesthesia Procedure Notes (Signed)
Procedure Name: MAC Date/Time: 03/15/2021 9:40 AM Performed by: Lowella Dell, CRNA Pre-anesthesia Checklist: Patient identified, Emergency Drugs available, Suction available, Patient being monitored and Timeout performed Patient Re-evaluated:Patient Re-evaluated prior to induction Oxygen Delivery Method: Nasal cannula Placement Confirmation: positive ETCO2 Dental Injury: Teeth and Oropharynx as per pre-operative assessment

## 2021-03-15 NOTE — Discharge Instructions (Signed)

## 2021-03-15 NOTE — Anesthesia Procedure Notes (Signed)
Spinal  Patient location during procedure: OR Start time: 03/15/2021 9:20 AM End time: 03/15/2021 9:34 AM Reason for block: surgical anesthesia Staffing Performed: anesthesiologist  Anesthesiologist: Suzette Battiest, MD Preanesthetic Checklist Completed: patient identified, IV checked, site marked, risks and benefits discussed, surgical consent, monitors and equipment checked, pre-op evaluation and timeout performed Spinal Block Patient position: sitting Prep: DuraPrep Patient monitoring: heart rate, cardiac monitor, continuous pulse ox and blood pressure Approach: right paramedian Location: L3-4 Injection technique: single-shot Needle Needle type: Quincke  Needle gauge: 22 G Needle length: 9 cm Assessment Sensory level: T4 Events: CSF return Additional Notes X 3 attempts at midline unsuccessful 2/2 bone encountered with deeper passes of needle. Free flowing CSF before and after injection LA via right paramedian approach.

## 2021-03-16 MED ORDER — OXYCODONE-ACETAMINOPHEN 5-325 MG PO TABS: 1.0000 | ORAL_TABLET | ORAL | 0 refills | Status: DC | PRN

## 2021-03-16 NOTE — Plan of Care (Signed)
Adequately Ready for Discharge 

## 2021-03-16 NOTE — Evaluation (Signed)
Occupational Therapy Evaluation Patient Details Name: Jenna Mccann MRN: [id number] DOB: 13-Mar-1961 Today's Date: 03/16/2021    History of Present Illness Pt is a 60 y/o female s/p L TKA. PMH includes back surgery.   Clinical Impression   Patient evaluated by Occupational Therapy with no further acute OT needs identified. All education has been completed and the patient has no further questions. Pt demonstrates good safety awareness and has good support of spouse.  See below for any follow-up Occupational Therapy or equipment needs. OT is signing off. Thank you for this referral.     Follow Up Recommendations  No OT follow up;Supervision - Intermittent    Equipment Recommendations  3 in 1 bedside commode;Tub/shower seat    Recommendations for Other Services       Precautions / Restrictions Precautions Precautions: Knee Precaution Booklet Issued: No Precaution Comments: Pt educated that NO pillow, roll, ice pack should be placed under knee, only under ankle. Restrictions Weight Bearing Restrictions: No LLE Weight Bearing: Weight bearing as tolerated      Mobility Bed Mobility Overal bed mobility: Needs Assistance Bed Mobility: Supine to Sit     Supine to sit: Min assist     General bed mobility comments: Assist for LLE advancement off EOB. Assist to hold knee in extension until pt scooted out fully to EOB to rest it down on the floor.    Transfers Overall transfer level: Needs assistance Equipment used: Rolling walker (2 wheeled) Transfers: Sit to/from Stand Sit to Stand: Min guard         General transfer comment: Increased time and effort but pt able to power-up to full stand without assistance. Hands on guarding provided for safety. VC's for feet positioning and hand placement on seated surface for safety prior to initiating stand.    Balance Overall balance assessment: Needs assistance Sitting-balance support: No upper extremity supported;Feet  supported Sitting balance-Leahy Scale: Good     Standing balance support: During functional activity Standing balance-Leahy Scale: Fair                             ADL either performed or assessed with clinical judgement   ADL Overall ADL's : Needs assistance/impaired Eating/Feeding: Independent   Grooming: Wash/dry hands;Wash/dry face;Oral care;Brushing hair;Min guard;Standing   Upper Body Bathing: Set up;Sitting   Lower Body Bathing: Minimal assistance;Sit to/from stand   Upper Body Dressing : Set up;Sitting   Lower Body Dressing: Minimal assistance;Sit to/from stand Lower Body Dressing Details (indicate cue type and reason): assist to don Lt shoe Toilet Transfer: Min guard;Ambulation;Comfort height toilet;Grab bars;RW   Toileting- Water quality scientist and Hygiene: Min guard;Sit to/from stand       Functional mobility during ADLs: Min guard;Rolling walker       Vision Baseline Vision/History: Wears glasses Wears Glasses: At all times Patient Visual Report: No change from baseline       Perception     Praxis      Pertinent Vitals/Pain Pain Assessment: Faces Faces Pain Scale: Hurts little more Pain Location: L knee Pain Descriptors / Indicators: Aching;Operative site guarding;Sore Pain Intervention(s): Premedicated before session;Repositioned;Ice applied     Hand Dominance Right   Extremity/Trunk Assessment Upper Extremity Assessment Upper Extremity Assessment: Overall WFL for tasks assessed   Lower Extremity Assessment Lower Extremity Assessment: Defer to PT evaluation LLE Deficits / Details: Deficits consistent with post op pain and weakness.   Cervical / Trunk Assessment Cervical / Trunk Assessment:  Normal   Communication Communication Communication: No difficulties   Cognition Arousal/Alertness: Awake/alert Behavior During Therapy: WFL for tasks assessed/performed Overall Cognitive Status: Within Functional Limits for tasks  assessed                                     General Comments  Pt instructed in safety with ADLs    Exercises Exercises: Total Joint Total Joint Exercises Ankle Circles/Pumps: 10 reps Quad Sets: 10 reps Short Arc Quad:  (eccentric lower) Heel Slides: 10 reps Hip ABduction/ADduction: 10 reps   Shoulder Instructions      Home Living Family/patient expects to be discharged to:: Private residence Living Arrangements: Spouse/significant other Available Help at Discharge: Family;Available 24 hours/day Type of Home: House Home Access: Ramped entrance     Home Layout: One level     Bathroom Shower/Tub: Teacher, early years/pre: Handicapped height Bathroom Accessibility: Yes   Home Equipment: Wheelchair - manual;Grab bars - tub/shower;Grab bars - toilet          Prior Functioning/Environment Level of Independence: Independent        Comments: Pt reports she has been performing ADLs mod I.  She relies on use of grab bars in bathroom for transfers, and spouse performs most IADLa        OT Problem List: Decreased activity tolerance;Decreased knowledge of use of DME or AE;Obesity;Pain      OT Treatment/Interventions:      OT Goals(Current goals can be found in the care plan section) Acute Rehab OT Goals Patient Stated Goal: to be able to do everything again OT Goal Formulation: With patient  OT Frequency:     Barriers to D/C:            Co-evaluation              AM-PAC OT "6 Clicks" Daily Activity     Outcome Measure Help from another person eating meals?: None Help from another person taking care of personal grooming?: A Little Help from another person toileting, which includes using toliet, bedpan, or urinal?: A Little Help from another person bathing (including washing, rinsing, drying)?: A Little Help from another person to put on and taking off regular upper body clothing?: A Little Help from another person to put on and  taking off regular lower body clothing?: A Little 6 Click Score: 19   End of Session Equipment Utilized During Treatment: Rolling walker Nurse Communication: Mobility status  Activity Tolerance: Patient tolerated treatment well Patient left: in bed;with call bell/phone within reach;with family/visitor present (EOB)  OT Visit Diagnosis: Pain Pain - Right/Left: Left Pain - part of body: Knee                Time: [id number] OT Time Calculation (min): 21 min Charges:  OT General Charges $OT Visit: 1 Visit OT Evaluation $OT Eval Low Complexity: 1 Low  Nilsa Nutting., OTR/L Acute Rehabilitation Services Pager [phone] Office [phone]   Lucille Passy M 03/16/2021, 11:43 AM

## 2021-03-16 NOTE — Discharge Summary (Signed)
Discharge Diagnoses:  Active Problems:   Arthritis of left knee   Surgeries: Procedure(s): LEFT TOTAL KNEE ARTHROPLASTY APPLICATION OF WOUND VAC on 03/15/2021    Consultants:   Discharged Condition: Improved  Hospital Course: Jenna Mccann is an 60 y.o. female who was admitted 03/15/2021 with a chief complaint of osteoarthritis left knee, with a final diagnosis of Osteoarthritis Left Knee.  Patient was brought to the operating room on 03/15/2021 and underwent Procedure(s): LEFT TOTAL KNEE ARTHROPLASTY APPLICATION OF WOUND VAC.    Patient was given perioperative antibiotics:  Anti-infectives (From admission, onward)    Start     Dose/Rate Route Frequency Ordered Stop   03/15/21 1549  ceFAZolin (ANCEF) 2-4 GM/100ML-% IVPB       Note to Pharmacy: Lawanda Cousins   : cabinet override      03/15/21 1549 03/15/21 1600   03/15/21 1245  ceFAZolin (ANCEF) IVPB 2g/100 mL premix        2 g 200 mL/hr over 30 Minutes Intravenous Every 6 hours 03/15/21 1234 03/15/21 2202   03/15/21 0700  ceFAZolin (ANCEF) IVPB 3g/100 mL premix        3 g 200 mL/hr over 30 Minutes Intravenous On call to O.R. 03/15/21 [id number] 03/15/21 0935     .  Patient was given sequential compression devices, early ambulation, and aspirin for DVT prophylaxis.  Recent vital signs: Patient Vitals for the past 24 hrs:  BP Temp Temp src Pulse Resp SpO2  03/16/21 0727 133/74 98 F (36.7 C) Oral 79 18 99 %  03/16/21 0306 133/71 98.1 F (36.7 C) Oral 74 20 99 %  03/15/21 1933 132/78 97.8 F (36.6 C) Oral 66 20 97 %  03/15/21 1652 (!) 162/92 -- -- 72 18 100 %  03/15/21 1621 (!) 155/77 (!) 97.4 F (36.3 C) -- 68 (!) 23 98 %  03/15/21 1609 (!) 150/86 -- -- 71 11 96 %  03/15/21 1549 (!) 159/84 -- -- 64 (!) 23 97 %  03/15/21 1540 (!) 130/119 -- -- 88 19 98 %  03/15/21 1524 -- -- -- 70 17 94 %  03/15/21 1509 (!) 167/96 -- -- 68 (!) 21 100 %  03/15/21 1439 (!) 150/82 -- -- 66 16 92 %  03/15/21 1424 -- -- -- 67 14 100 %   03/15/21 1409 (!) 142/81 -- -- 66 14 94 %  03/15/21 1354 (!) 139/109 -- -- 63 16 99 %  03/15/21 1339 (!) 144/82 -- -- 66 17 90 %  03/15/21 1324 (!) 154/84 98.1 F (36.7 C) -- 64 17 98 %  03/15/21 1309 (!) 154/80 -- -- 64 20 100 %  03/15/21 1254 138/84 -- -- 63 14 97 %  03/15/21 1239 137/82 -- -- 63 14 96 %  03/15/21 1224 (!) 146/82 -- -- (!) 59 18 99 %  03/15/21 1209 (!) 143/78 -- -- (!) 58 13 98 %  03/15/21 1154 139/76 -- -- (!) 58 19 97 %  03/15/21 1139 124/76 -- -- 60 20 97 %  .  Recent laboratory studies: No results found.  Discharge Medications:   Allergies as of 03/16/2021   No Known Allergies      Medication List     TAKE these medications    DULoxetine 60 MG capsule Commonly known as: CYMBALTA Take 60 mg by mouth daily.   gabapentin 300 MG capsule Commonly known as: NEURONTIN Take 300 mg by mouth 2 (two) times daily.   ibuprofen 800 MG tablet Commonly known  as: ADVIL Take 800 mg by mouth in the morning and at bedtime.   olmesartan 40 MG tablet Commonly known as: BENICAR Take 20 mg by mouth at bedtime.   oxyCODONE-acetaminophen 5-325 MG tablet Commonly known as: PERCOCET/ROXICET Take 1 tablet by mouth every 4 (four) hours as needed.   polyethylene glycol 17 g packet Commonly known as: MIRALAX / GLYCOLAX Take 17 g by mouth daily as needed for mild constipation.               Durable Medical Equipment  (From admission, onward)           Start     Ordered   03/16/21 1021  For home use only DME 3 n 1  Once        03/16/21 1021   03/16/21 1020  For home use only DME 3 n 1  Once       Comments: BARIATRIC   03/16/21 1021   03/16/21 1020  For home use only DME Walker wide  Once       Question:  Patient needs a walker to treat with the following condition  Answer:  Total knee replacement status, left   03/16/21 1021            Diagnostic Studies: No results found.  Patient benefited maximally from their hospital stay and there were no  complications.     Disposition: Discharge disposition: 01-Home or Self Care      Discharge Instructions     Call MD / Call 911   Complete by: As directed    If you experience chest pain or shortness of breath, CALL 911 and be transported to the hospital emergency room.  If you develope a fever above 101 F, pus (white drainage) or increased drainage or redness at the wound, or calf pain, call your surgeon's office.   Constipation Prevention   Complete by: As directed    Drink plenty of fluids.  Prune juice may be helpful.  You may use a stool softener, such as Colace (over the counter) 100 mg twice a day.  Use MiraLax (over the counter) for constipation as needed.   Diet - low sodium heart healthy   Complete by: As directed    Increase activity slowly as tolerated   Complete by: As directed    Negative Pressure Wound Therapy - Incisional   Complete by: As directed    Show patient how to attach preveena vac   Post-operative opioid taper instructions:   Complete by: As directed    POST-OPERATIVE OPIOID TAPER INSTRUCTIONS: It is important to wean off of your opioid medication as soon as possible. If you do not need pain medication after your surgery it is ok to stop day one. Opioids include: Codeine, Hydrocodone(Norco, Vicodin), Oxycodone(Percocet, oxycontin) and hydromorphone amongst others.  Long term and even short term use of opiods can cause: Increased pain response Dependence Constipation Depression Respiratory depression And more.  Withdrawal symptoms can include Flu like symptoms Nausea, vomiting And more Techniques to manage these symptoms Hydrate well Eat regular healthy meals Stay active Use relaxation techniques(deep breathing, meditating, yoga) Do Not substitute Alcohol to help with tapering If you have been on opioids for less than two weeks and do not have pain than it is ok to stop all together.  Plan to wean off of opioids This plan should start within  one week post op of your joint replacement. Maintain the same interval or time between taking each dose  and first decrease the dose.  Cut the total daily intake of opioids by one tablet each day Next start to increase the time between doses. The last dose that should be eliminated is the evening dose.          Follow-up Information     Suzan Slick, NP Follow up in 1 week(s).   Specialty: Orthopedic Surgery Contact information: [address] [place] [address] [phone]                  Signed: [name] 03/16/2021, 11:32 AM

## 2021-03-16 NOTE — Progress Notes (Signed)
Physical Therapy Treatment Patient Details Name: Jenna Mccann MRN: [id number] DOB: 1961/02/25 Today's Date: 03/16/2021    History of Present Illness Pt is a 60 y/o female s/p L TKA on 03/15/2021 and is currently WBAT on the LLE. PMH includes back surgery.    PT Comments    Pt progressing towards physical therapy goals. She was able to perform transfers and ambulation with gross min guard assist and RW for support. Pt demonstrating increased gait speed this session and was able to improve overall gait pattern with RW for support. Pt and husband were educated on car transfer, HEP program technique and frequency, and general safety with mobility in the home environment. Pt reports she is ready for d/c home. Will continue to follow and progress as able per POC.     Follow Up Recommendations  Follow surgeon's recommendation for DC plan and follow-up therapies     Equipment Recommendations  3in1 (PT) ([place]ari)    Recommendations for Other Services       Precautions / Restrictions Precautions Precautions: Knee Precaution Booklet Issued: Yes (comment) (Total knee replacement HEP) Precaution Comments: Pt educated that NO pillow, roll, ice pack should be placed under knee, only under ankle. Restrictions Weight Bearing Restrictions: Yes LLE Weight Bearing: Weight bearing as tolerated    Mobility  Bed Mobility Overal bed mobility: Needs Assistance Bed Mobility: Supine to Sit     Supine to sit: Min assist     General bed mobility comments: Pt was received sitting up in the recliner.    Transfers Overall transfer level: Needs assistance Equipment used: Rolling walker (2 wheeled) Transfers: Sit to/from Stand Sit to Stand: Min guard         General transfer comment: Increased time and effort but pt able to power-up to full stand without assistance. VC's for feet positioning and hand placement on seated surface for safety prior to initiating stand and LLE extended out prior to  initiating stand>sit.  Ambulation/Gait Ambulation/Gait assistance: Min guard Gait Distance (Feet): 200 Feet Assistive device: Rolling walker (2 wheeled) Gait Pattern/deviations: Step-to pattern;Step-through pattern;Decreased stride length;Trunk flexed;Decreased dorsiflexion - left;Decreased weight shift to left Gait velocity: Decreased Gait velocity interpretation: <1.31 ft/sec, indicative of household ambulator General Gait Details: VC's for improved posture, closer walker proximity, increased heel strike, increased knee flexion during toe off to decrease circumduction.   Stairs Stairs:  (Deferred, has a ramp)           Wheelchair Mobility    Modified Rankin (Stroke Patients Only)       Balance Overall balance assessment: Needs assistance Sitting-balance support: No upper extremity supported;Feet supported Sitting balance-Leahy Scale: Good     Standing balance support: During functional activity Standing balance-Leahy Scale: Fair                              Cognition Arousal/Alertness: Awake/alert Behavior During Therapy: WFL for tasks assessed/performed Overall Cognitive Status: Within Functional Limits for tasks assessed                                        Exercises Total Joint Exercises Ankle Circles/Pumps: 10 reps Quad Sets: 10 reps Short Arc Quad:  (eccentric lower) Heel Slides: 10 reps;Seated Hip ABduction/ADduction: 10 reps Other Exercises Other Exercises: Reviewed HEP handout and discussed technique for each exercise.    General Comments General  comments (skin integrity, edema, etc.): Pt instructed in safety with ADLs      Pertinent Vitals/Pain Pain Assessment: 0-10 Pain Score: 7  Faces Pain Scale: Hurts little more Pain Location: L knee Pain Descriptors / Indicators: Aching;Operative site guarding;Sore Pain Intervention(s): Limited activity within patient's tolerance;Monitored during session;Repositioned     Home Living Family/patient expects to be discharged to:: Private residence Living Arrangements: Spouse/significant other Available Help at Discharge: Family;Available 24 hours/day Type of Home: House Home Access: Ramped entrance   Home Layout: One level Home Equipment: Wheelchair - manual;Grab bars - tub/shower;Grab bars - toilet      Prior Function Level of Independence: Independent      Comments: Pt reports she has been performing ADLs mod I.  She relies on use of grab bars in bathroom for transfers, and spouse performs most IADLa   PT Goals (current goals can now be found in the care plan section) Acute Rehab PT Goals Patient Stated Goal: to be able to do everything again PT Goal Formulation: With patient/family Time For Goal Achievement: 03/29/21 Potential to Achieve Goals: Fair Progress towards PT goals: Progressing toward goals    Frequency    7X/week      PT Plan Current plan remains appropriate    Co-evaluation              AM-PAC PT "6 Clicks" Mobility   Outcome Measure  Help needed turning from your back to your side while in a flat bed without using bedrails?: A Little Help needed moving from lying on your back to sitting on the side of a flat bed without using bedrails?: A Little Help needed moving to and from a bed to a chair (including a wheelchair)?: A Little Help needed standing up from a chair using your arms (e.g., wheelchair or bedside chair)?: A Little Help needed to walk in hospital room?: A Little Help needed climbing 3-5 steps with a railing? : A Lot 6 Click Score: 17    End of Session Equipment Utilized During Treatment: Gait belt Activity Tolerance: Patient limited by pain Patient left: in chair;with call bell/phone within reach;with family/visitor present Nurse Communication: Mobility status PT Visit Diagnosis: Other abnormalities of gait and mobility (R26.89);Pain Pain - Right/Left: Left Pain - part of body: Knee     Time:  [id number] PT Time Calculation (min) (ACUTE ONLY): 33 min  Charges:  $Gait Training: 23-37 mins $Therapeutic Exercise: 8-22 mins $Therapeutic Activity: 8-22 mins                     Rolinda Roan, PT, DPT Acute Rehabilitation Services Pager: [phone] Office: [phone]    Thelma Comp 03/16/2021, 2:04 PM

## 2021-03-16 NOTE — Progress Notes (Signed)
Physical Therapy Treatment Patient Details Name: Jenna Mccann MRN: [id number] DOB: 1961-04-17 Today's Date: 03/16/2021    History of Present Illness Pt is a 60 y/o female s/p L TKA. PMH includes back surgery.    PT Comments    Pt progressing towards physical therapy goals. Husband present to assist with chair follow during gait training, however pt did not end up needing it. Pt reports although she is in pain, she is mobilizing better than prior to surgery and will still have the wheelchair available for entrance into the home. Will see pt again for second session today, however anticipate pt will be ready for d/c afterwards from a PT standpoint.    Follow Up Recommendations  Follow surgeon's recommendation for DC plan and follow-up therapies     Equipment Recommendations  3in1 (PT) ([facility]? She may not qualify due to weight)    Recommendations for Other Services       Precautions / Restrictions Precautions Precautions: Knee Precaution Booklet Issued: No Precaution Comments: Pt educated that NO pillow, roll, ice pack should be placed under knee, only under ankle. Restrictions Weight Bearing Restrictions: No LLE Weight Bearing: Weight bearing as tolerated    Mobility  Bed Mobility Overal bed mobility: Needs Assistance Bed Mobility: Supine to Sit     Supine to sit: Min assist     General bed mobility comments: Assist for LLE advancement off EOB. Assist to hold knee in extension until pt scooted out fully to EOB to rest it down on the floor.    Transfers Overall transfer level: Needs assistance Equipment used: Rolling walker (2 wheeled) Transfers: Sit to/from Stand Sit to Stand: Min guard         General transfer comment: Increased time and effort but pt able to power-up to full stand without assistance. Hands on guarding provided for safety. VC's for feet positioning and hand placement on seated surface for safety prior to initiating  stand.  Ambulation/Gait Ambulation/Gait assistance: Min assist;Min guard Gait Distance (Feet): 75 Feet Assistive device: Rolling walker (2 wheeled) Gait Pattern/deviations: Step-to pattern;Step-through pattern;Decreased stride length;Trunk flexed;Decreased dorsiflexion - left;Decreased weight shift to left Gait velocity: Decreased Gait velocity interpretation: <1.31 ft/sec, indicative of household ambulator General Gait Details: Min assist for walker management progressing to gross min guard assist by end of session. Pt able to progress to step-through gait pattern. Initiated increased knee flexion during toe off, however minimal due to pain and noted pt circumducting to advance LLE.   Stairs             Wheelchair Mobility    Modified Rankin (Stroke Patients Only)       Balance Overall balance assessment: Needs assistance Sitting-balance support: No upper extremity supported;Feet supported Sitting balance-Leahy Scale: Fair                                      Cognition Arousal/Alertness: Awake/alert Behavior During Therapy: WFL for tasks assessed/performed Overall Cognitive Status: Within Functional Limits for tasks assessed                                        Exercises Total Joint Exercises Ankle Circles/Pumps: 10 reps Quad Sets: 10 reps Short Arc Quad: 5 reps (eccentric lower) Heel Slides: 10 reps Hip ABduction/ADduction: 10 reps    General Comments  Pertinent Vitals/Pain Pain Assessment: Faces Faces Pain Scale: Hurts whole lot Pain Location: L knee Pain Descriptors / Indicators: Aching;Operative site guarding;Sore Pain Intervention(s): Limited activity within patient's tolerance;Monitored during session;Repositioned    Home Living Family/patient expects to be discharged to:: Private residence Living Arrangements: Spouse/significant other Available Help at Discharge: Family;Available 24 hours/day Type of Home:  House Home Access: Level entry   Home Layout: One level Home Equipment: Wheelchair - manual      Prior Function Level of Independence: Independent          PT Goals (current goals can now be found in the care plan section) Acute Rehab PT Goals Patient Stated Goal: to decrease pain and be able to go out to eat again PT Goal Formulation: With patient/family Time For Goal Achievement: 03/29/21 Potential to Achieve Goals: Fair Progress towards PT goals: Progressing toward goals    Frequency    7X/week      PT Plan Current plan remains appropriate    Co-evaluation              AM-PAC PT "6 Clicks" Mobility   Outcome Measure  Help needed turning from your back to your side while in a flat bed without using bedrails?: A Little Help needed moving from lying on your back to sitting on the side of a flat bed without using bedrails?: A Little Help needed moving to and from a bed to a chair (including a wheelchair)?: A Little Help needed standing up from a chair using your arms (e.g., wheelchair or bedside chair)?: A Little Help needed to walk in hospital room?: A Little Help needed climbing 3-5 steps with a railing? : A Lot 6 Click Score: 17    End of Session Equipment Utilized During Treatment: Gait belt Activity Tolerance: Patient limited by pain Patient left: in chair;with call bell/phone within reach;with family/visitor present Nurse Communication: Mobility status PT Visit Diagnosis: Other abnormalities of gait and mobility (R26.89);Pain Pain - Right/Left: Left Pain - part of body: Knee     Time: 0811-0907 PT Time Calculation (min) (ACUTE ONLY): 56 min  Charges:  $Gait Training: 23-37 mins $Therapeutic Exercise: 8-22 mins $Therapeutic Activity: 8-22 mins                     Rolinda Roan, PT, DPT Acute Rehabilitation Services Pager: [phone] Office: [phone]    Thelma Comp 03/16/2021, 10:38 AM

## 2021-03-16 NOTE — Progress Notes (Signed)
Patient alert and oriented, voiding adequately, MAE well with no difficulty. Incision area cdi with no s/s of infection. Provera wound vac in place and patient educated and understood. Patient discharged home per order. Patient and husband stated understanding of discharge instructions given. Patient has an appointment with Dr. Sharol Given

## 2021-03-18 ENCOUNTER — Encounter (HOSPITAL_COMMUNITY): Payer: Self-pay | Admitting: Orthopedic Surgery

## 2021-03-18 NOTE — Anesthesia Postprocedure Evaluation (Signed)
Anesthesia Post Note  Patient: Jenna Mccann  Procedure(s) Performed: LEFT TOTAL KNEE ARTHROPLASTY (Left: Knee) APPLICATION OF WOUND VAC     Patient location during evaluation: PACU Anesthesia Type: Spinal Level of consciousness: awake and alert Pain management: pain level controlled Vital Signs Assessment: post-procedure vital signs reviewed and stable Respiratory status: spontaneous breathing and respiratory function stable Cardiovascular status: blood pressure returned to baseline and stable Postop Assessment: spinal receding Anesthetic complications: no   No notable events documented.  Last Vitals:  Vitals:   03/16/21 0306 03/16/21 0727  BP: 133/71 133/74  Pulse: 74 79  Resp: 20 18  Temp: 36.7 C 36.7 C  SpO2: 99% 99%    Last Pain:  Vitals:   03/16/21 1251  TempSrc:   PainSc: 3                  Tiajuana Amass

## 2021-03-19 ENCOUNTER — Encounter: Payer: Self-pay | Admitting: Orthopedic Surgery

## 2021-03-19 ENCOUNTER — Ambulatory Visit (INDEPENDENT_AMBULATORY_CARE_PROVIDER_SITE_OTHER): Payer: Self-pay

## 2021-03-19 ENCOUNTER — Ambulatory Visit (INDEPENDENT_AMBULATORY_CARE_PROVIDER_SITE_OTHER): Payer: Self-pay | Admitting: Physician Assistant

## 2021-03-19 DIAGNOSIS — Z96652 Presence of left artificial knee joint: Secondary | ICD-10-CM

## 2021-03-19 NOTE — Progress Notes (Signed)
Office Visit Note   Patient: Jenna Mccann           Date of Birth: 1961/04/22           MRN: [id number] Visit Date: 03/19/2021              Requested by: Massenburg, O'Laf, PA-C [address] [place],  [place] [address] PCP: Sofie Rower, PA-C  Chief Complaint  Patient presents with   Left Knee - Routine Post Op      HPI: Patient is 4 days status post left total knee arthroplasty she is doing quite well without complaints.  Her husband has been helping her work on range of motion exercises.  Assessment & Plan: Visit Diagnoses:  1. Status post left knee replacement     Plan: Continue with range of motion she may cleanse with antibacterial soap and water starting tomorrow.  Should apply clean dry dressing follow-up in 1 week she does not need x-rays I reminded her to be taking a baby aspirin daily for prevention of blood clots  Follow-Up Instructions: No follow-ups on file.   Ortho Exam  Patient is alert, oriented, no adenopathy, well-dressed, normal affect, normal respiratory effort. Examination well apposed wound edges no surrounding erythema no drainage other than a small amount of dark bloody drainage once the wound VAC was removed.  She flexes to 85 degrees and comes to almost full extension.  Compartments are soft and compressible negative Homans' sign  Imaging: XR Knee 1-2 Views Left  Result Date: 03/19/2021 2 views of her left knee were taken today.  Status post left total knee arthroplasty components are in good position well-maintained alignment  No images are attached to the encounter.  Labs: Lab Results  Component Value Date   HGBA1C 5.1 03/03/2015     Lab Results  Component Value Date   ALBUMIN 3.6 11/20/2017    No results found for: MG No results found for: VD25OH  No results found for: PREALBUMIN CBC EXTENDED Latest Ref Rng & Units 03/11/2021 02/07/2021 08/02/2019  WBC 4.0 - 10.5 K/uL 6.3 6.4 6.5  RBC 3.87 - 5.11 MIL/uL 4.21 4.30 4.37   HGB 12.0 - 15.0 g/dL 12.1 12.4 12.7  HCT 36.0 - 46.0 % 37.4 38.2 39.6  PLT 150 - 400 K/uL 248 261 258     There is no height or weight on file to calculate BMI.  Orders:  Orders Placed This Encounter  Procedures   XR Knee 1-2 Views Left   No orders of the defined types were placed in this encounter.    Procedures: No procedures performed  Clinical Data: No additional findings.  ROS:  All other systems negative, except as noted in the HPI. Review of Systems  Objective: Vital Signs: There were no vitals taken for this visit.  Specialty Comments:  No specialty comments available.  PMFS History: Patient Active Problem List   Diagnosis Date Noted   Arthritis of left knee 03/15/2021   Acute pericarditis 03/04/2015   Morbid obesity ([facility]) 03/04/2015   Normocytic anemia 03/04/2015   Abnormal EKG 03/02/2015   Chest pain    Frequent UTI 07/05/2014   Incomplete emptying of bladder 07/05/2014   Dyslipidemia 06/29/2014   Tubular adenoma of colon 05/09/2014   Colon, diverticulosis 05/06/2014   Diverticulosis of large intestine without hemorrhage 05/06/2014   Morbid obesity with BMI of 40.0-44.9, adult ([facility]) 03/27/2014   Past Medical History:  Diagnosis Date   Anxiety  Arthritis    Diverticulitis    History of cardiac catheterization    LHC during admit for pericarditis 8/16:  normal coronary arteries   History of echocardiogram    a. Echo 03/03/15:  mild to mod LVH, EF 55-60%, no RWMA, mildly dilated aortic root, mild LAE   Hypertension    Normocytic anemia    Pericarditis 02/26/2015    No family history on file.  Past Surgical History:  Procedure Laterality Date   ABDOMINAL HYSTERECTOMY     APPLICATION OF WOUND VAC  03/15/2021   Procedure: APPLICATION OF WOUND VAC;  Surgeon: [name], MD;  Location: [facility];  Service: Orthopedics;;   BACK SURGERY     CARDIAC CATHETERIZATION N/A 03/02/2015   Procedure: Left Heart Cath and Coronary Angiography;  Surgeon:  Belva Crome, MD;  Location: [facility];  Service: Cardiovascular;  Laterality: N/A;   CHOLECYSTECTOMY     ROTATOR CUFF REPAIR Right    TOTAL KNEE ARTHROPLASTY Left 03/15/2021   Procedure: LEFT TOTAL KNEE ARTHROPLASTY;  Surgeon: [name], MD;  Location: [facility];  Service: Orthopedics;  Laterality: Left;   Social History   Occupational History   Not on file  Tobacco Use   Smoking status: Never   Smokeless tobacco: Never  Vaping Use   Vaping Use: Never used  Substance and Sexual Activity   Alcohol use: No   Drug use: Never   Sexual activity: Not Currently

## 2021-03-21 ENCOUNTER — Other Ambulatory Visit (INDEPENDENT_AMBULATORY_CARE_PROVIDER_SITE_OTHER): Payer: Self-pay | Admitting: Orthopedic Surgery

## 2021-03-22 ENCOUNTER — Telehealth: Payer: Self-pay | Admitting: Orthopedic Surgery

## 2021-03-22 MED ORDER — OXYCODONE-ACETAMINOPHEN 5-325 MG PO TABS: 1.0000 | ORAL_TABLET | Freq: Four times a day (QID) | ORAL | 0 refills | Status: DC | PRN

## 2021-03-22 NOTE — Telephone Encounter (Signed)
Pt is s/p a left total knee 03/15/21 this may be a duplicate refill request.

## 2021-03-22 NOTE — Telephone Encounter (Signed)
Patient called. She would like oxycodone called in for pain. Her call back number is [phone]

## 2021-03-26 ENCOUNTER — Ambulatory Visit (INDEPENDENT_AMBULATORY_CARE_PROVIDER_SITE_OTHER): Payer: Self-pay | Admitting: Physician Assistant

## 2021-03-26 ENCOUNTER — Encounter: Payer: Self-pay | Admitting: Orthopedic Surgery

## 2021-03-26 ENCOUNTER — Ambulatory Visit: Payer: Self-pay

## 2021-03-26 DIAGNOSIS — Z96652 Presence of left artificial knee joint: Secondary | ICD-10-CM

## 2021-03-26 MED ORDER — OXYCODONE-ACETAMINOPHEN 5-325 MG PO TABS: 1.0000 | ORAL_TABLET | Freq: Four times a day (QID) | ORAL | 0 refills | Status: DC | PRN

## 2021-03-26 NOTE — Progress Notes (Signed)
Office Visit Note   Patient: Jenna Mccann           Date of Birth: 09/02/1960           MRN: [id number] Visit Date: 03/26/2021              Requested by: Massenburg, O'Laf, PA-C [address] [place],   [id number] PCP: Sofie Rower, PA-C  Chief Complaint  Patient presents with   Left Knee - Routine Post Op      HPI: This is a pleasant 60 year old woman who is now 11 days status post total knee replacement on the left.  She is doing well and comes in today ambulating without an assistive device denies fever, chills, or calf pain  Assessment & Plan: Visit Diagnoses:  1. Status post left knee replacement     Plan: Have given her prescription to obtain 2 XL compression socks.  We will follow-up in 1 week at which time we will remove the surgical staples.  Follow-Up Instructions: No follow-ups on file.   Ortho Exam  Patient is alert, oriented, no adenopathy, well-dressed, normal affect, normal respiratory effort. Examination demonstrates healing surgical incision incision with well apposed wound edges just a spot of serous drainage distally she does have some erythema but no ascending cellulitis.  This is been stable since her last visit.  Compartments are soft and compressible negative Bevelyn Buckles' sign she comes out to within 1 or 2 degrees of full extension and flexes to 90 degrees.  Imaging: No results found. No images are attached to the encounter.  Labs: Lab Results  Component Value Date   HGBA1C 5.1 03/03/2015     Lab Results  Component Value Date   ALBUMIN 3.6 11/20/2017    No results found for: MG No results found for: VD25OH  No results found for: PREALBUMIN CBC EXTENDED Latest Ref Rng & Units 03/11/2021 02/07/2021 08/02/2019  WBC 4.0 - 10.5 K/uL 6.3 6.4 6.5  RBC 3.87 - 5.11 MIL/uL 4.21 4.30 4.37  HGB 12.0 - 15.0 g/dL 12.1 12.4 12.7  HCT 36.0 - 46.0 % 37.4 38.2 39.6  PLT 150 - 400 K/uL 248 261 258     There is no height or weight on file  to calculate BMI.  Orders:  No orders of the defined types were placed in this encounter.  Meds ordered this encounter  Medications   oxyCODONE-acetaminophen (PERCOCET/ROXICET) 5-325 MG tablet    Sig: Take 1 tablet by mouth every 6 (six) hours as needed.    Dispense:  28 tablet    Refill:  0     Procedures: No procedures performed  Clinical Data: No additional findings.  ROS:  All other systems negative, except as noted in the HPI. Review of Systems  Objective: Vital Signs: There were no vitals taken for this visit.  Specialty Comments:  No specialty comments available.  PMFS History: Patient Active Problem List   Diagnosis Date Noted   Arthritis of left knee 03/15/2021   Acute pericarditis 03/04/2015   Morbid obesity ([facility]) 03/04/2015   Normocytic anemia 03/04/2015   Abnormal EKG 03/02/2015   Chest pain    Frequent UTI 07/05/2014   Incomplete emptying of bladder 07/05/2014   Dyslipidemia 06/29/2014   Tubular adenoma of colon 05/09/2014   Colon, diverticulosis 05/06/2014   Diverticulosis of large intestine without hemorrhage 05/06/2014   Morbid obesity with BMI of 40.0-44.9, adult ([facility]) 03/27/2014   Past Medical History:  Diagnosis Date  Anxiety    Arthritis    Diverticulitis    History of cardiac catheterization    LHC during admit for pericarditis 8/16:  normal coronary arteries   History of echocardiogram    a. Echo 03/03/15:  mild to mod LVH, EF 55-60%, no RWMA, mildly dilated aortic root, mild LAE   Hypertension    Normocytic anemia    Pericarditis 02/26/2015    No family history on file.  Past Surgical History:  Procedure Laterality Date   ABDOMINAL HYSTERECTOMY     APPLICATION OF WOUND VAC  03/15/2021   Procedure: APPLICATION OF WOUND VAC;  Surgeon: [name], MD;  Location: [facility];  Service: Orthopedics;;   BACK SURGERY     CARDIAC CATHETERIZATION N/A 03/02/2015   Procedure: Left Heart Cath and Coronary Angiography;  Surgeon: Belva Crome,  MD;  Location: [facility];  Service: Cardiovascular;  Laterality: N/A;   CHOLECYSTECTOMY     ROTATOR CUFF REPAIR Right    TOTAL KNEE ARTHROPLASTY Left 03/15/2021   Procedure: LEFT TOTAL KNEE ARTHROPLASTY;  Surgeon: [name], MD;  Location: [facility];  Service: Orthopedics;  Laterality: Left;   Social History   Occupational History   Not on file  Tobacco Use   Smoking status: Never   Smokeless tobacco: Never  Vaping Use   Vaping Use: Never used  Substance and Sexual Activity   Alcohol use: No   Drug use: Never   Sexual activity: Not Currently

## 2021-04-02 ENCOUNTER — Ambulatory Visit (INDEPENDENT_AMBULATORY_CARE_PROVIDER_SITE_OTHER): Payer: Self-pay | Admitting: Family

## 2021-04-02 ENCOUNTER — Other Ambulatory Visit: Payer: Self-pay

## 2021-04-02 DIAGNOSIS — M1712 Unilateral primary osteoarthritis, left knee: Secondary | ICD-10-CM

## 2021-04-02 DIAGNOSIS — Z96652 Presence of left artificial knee joint: Secondary | ICD-10-CM

## 2021-04-02 MED ORDER — OXYCODONE-ACETAMINOPHEN 5-325 MG PO TABS: 1.0000 | ORAL_TABLET | Freq: Three times a day (TID) | ORAL | 0 refills | Status: DC | PRN

## 2021-04-02 NOTE — Progress Notes (Signed)
Post-Op Visit Note   Patient: Jenna Mccann           Date of Birth: 07/19/1961           MRN: [id number] Visit Date: 04/02/2021 PCP: Sofie Rower, PA-C  Chief Complaint:  Chief Complaint  Patient presents with   Left Knee - Follow-up    HPI:  HPI The patient is a 60 year old woman seen status post left total knee arthroplasty.  She is completed her home health physical therapy and is now doing her own exercise program independently.  She is pleased with her improvement in pain improvement in swelling and increased mobility.  Ortho Exam On examination of the left knee the incision is well-healed staples are in place these were harvested today without incident.  She has near full extension..  Flexion to 80 degrees..  Visit Diagnoses: No diagnosis found.  Plan: Staples harvested.  She will continue home exercise program discussed importance of working on range of motion she will follow-up in the office once more in 4 weeks  Follow-Up Instructions: No follow-ups on file.   Imaging: No results found.  Orders:  No orders of the defined types were placed in this encounter.  Meds ordered this encounter  Medications   oxyCODONE-acetaminophen (PERCOCET/ROXICET) 5-325 MG tablet    Sig: Take 1 tablet by mouth every 8 (eight) hours as needed.    Dispense:  21 tablet    Refill:  0     PMFS History: Patient Active Problem List   Diagnosis Date Noted   Arthritis of left knee 03/15/2021   Acute pericarditis 03/04/2015   Morbid obesity ([facility]) 03/04/2015   Normocytic anemia 03/04/2015   Abnormal EKG 03/02/2015   Chest pain    Frequent UTI 07/05/2014   Incomplete emptying of bladder 07/05/2014   Dyslipidemia 06/29/2014   Tubular adenoma of colon 05/09/2014   Colon, diverticulosis 05/06/2014   Diverticulosis of large intestine without hemorrhage 05/06/2014   Morbid obesity with BMI of 40.0-44.9, adult ([facility]) 03/27/2014   Past Medical History:  Diagnosis Date    Anxiety    Arthritis    Diverticulitis    History of cardiac catheterization    LHC during admit for pericarditis 8/16:  normal coronary arteries   History of echocardiogram    a. Echo 03/03/15:  mild to mod LVH, EF 55-60%, no RWMA, mildly dilated aortic root, mild LAE   Hypertension    Normocytic anemia    Pericarditis 02/26/2015    No family history on file.  Past Surgical History:  Procedure Laterality Date   ABDOMINAL HYSTERECTOMY     APPLICATION OF WOUND VAC  03/15/2021   Procedure: APPLICATION OF WOUND VAC;  Surgeon: [name], MD;  Location: [facility];  Service: Orthopedics;;   BACK SURGERY     CARDIAC CATHETERIZATION N/A 03/02/2015   Procedure: Left Heart Cath and Coronary Angiography;  Surgeon: Belva Crome, MD;  Location: [facility];  Service: Cardiovascular;  Laterality: N/A;   CHOLECYSTECTOMY     ROTATOR CUFF REPAIR Right    TOTAL KNEE ARTHROPLASTY Left 03/15/2021   Procedure: LEFT TOTAL KNEE ARTHROPLASTY;  Surgeon: [name], MD;  Location: [facility];  Service: Orthopedics;  Laterality: Left;   Social History   Occupational History   Not on file  Tobacco Use   Smoking status: Never   Smokeless tobacco: Never  Vaping Use   Vaping Use: Never used  Substance and Sexual Activity   Alcohol use:  No   Drug use: Never   Sexual activity: Not Currently

## 2021-04-10 ENCOUNTER — Telehealth: Payer: Self-pay | Admitting: Family

## 2021-04-10 MED ORDER — OXYCODONE-ACETAMINOPHEN 5-325 MG PO TABS: 1.0000 | ORAL_TABLET | Freq: Three times a day (TID) | ORAL | 0 refills | Status: DC | PRN

## 2021-04-10 NOTE — Telephone Encounter (Signed)
Pt called requesting a refill of oxycodone. Please send to pharmacy on file. Pt phone number is [phone].

## 2021-04-10 NOTE — Telephone Encounter (Signed)
03/15/21 s/p total knee replacement requesting refill on pain medication. Please advise.

## 2021-04-19 ENCOUNTER — Other Ambulatory Visit: Payer: Self-pay

## 2021-04-19 ENCOUNTER — Encounter: Payer: Self-pay | Admitting: Family

## 2021-04-19 ENCOUNTER — Ambulatory Visit (INDEPENDENT_AMBULATORY_CARE_PROVIDER_SITE_OTHER): Payer: Self-pay | Admitting: Family

## 2021-04-19 DIAGNOSIS — L03116 Cellulitis of left lower limb: Secondary | ICD-10-CM

## 2021-04-19 DIAGNOSIS — Z96652 Presence of left artificial knee joint: Secondary | ICD-10-CM

## 2021-04-19 MED ORDER — DOXYCYCLINE HYCLATE 100 MG PO TABS: 100.0000 mg | ORAL_TABLET | Freq: Two times a day (BID) | ORAL | 0 refills | Status: DC

## 2021-04-19 NOTE — Progress Notes (Signed)
Post-Op Visit Note   Patient: Jenna Mccann           Date of Birth: 01/06/1961           MRN: [id number] Visit Date: 04/19/2021 PCP: Sofie Rower, PA-C  Chief Complaint:  Chief Complaint  Patient presents with   Left Knee - Routine Post Op    03/15/21 left TKA    HPI:  HPI Her the patient is a 60 year old woman who presents today for concern for infection.  She is status post total knee arthroplasty on the left on August 19.  She is complaining of warmth tenderness to touch and some mild drainage states it has been clear this has been at the proximal end of her incision. Ortho Exam On examination of the left knee her incision is well-healed she does have what appears to be a suture abscess at the most proximal end of her incision with some surrounding cellulitis.  This is not ascending area of cellulitis about 3 cm in diameter this was marked today.  There is no area of fluctuance.  No drainage  Visit Diagnoses: No diagnosis found.  Plan: We will place her on doxycycline.  Discussed the importance of following closely.  If this fails to improve over the weekend or worsens she will report to the emergency department or call the on-call physician.  Follow-up in the office in 1 week  Follow-Up Instructions: Return in about 1 week (around 04/26/2021).   Imaging: No results found.  Orders:  No orders of the defined types were placed in this encounter.  Meds ordered this encounter  Medications   doxycycline (VIBRA-TABS) 100 MG tablet    Sig: Take 1 tablet (100 mg total) by mouth 2 (two) times daily.    Dispense:  60 tablet    Refill:  0     PMFS History: Patient Active Problem List   Diagnosis Date Noted   Arthritis of left knee 03/15/2021   Acute pericarditis 03/04/2015   Morbid obesity ([facility]) 03/04/2015   Normocytic anemia 03/04/2015   Abnormal EKG 03/02/2015   Chest pain    Frequent UTI 07/05/2014   Incomplete emptying of bladder 07/05/2014   Dyslipidemia  06/29/2014   Tubular adenoma of colon 05/09/2014   Colon, diverticulosis 05/06/2014   Diverticulosis of large intestine without hemorrhage 05/06/2014   Morbid obesity with BMI of 40.0-44.9, adult ([facility]) 03/27/2014   Past Medical History:  Diagnosis Date   Anxiety    Arthritis    Diverticulitis    History of cardiac catheterization    LHC during admit for pericarditis 8/16:  normal coronary arteries   History of echocardiogram    a. Echo 03/03/15:  mild to mod LVH, EF 55-60%, no RWMA, mildly dilated aortic root, mild LAE   Hypertension    Normocytic anemia    Pericarditis 02/26/2015    History reviewed. No pertinent family history.  Past Surgical History:  Procedure Laterality Date   ABDOMINAL HYSTERECTOMY     APPLICATION OF WOUND VAC  03/15/2021   Procedure: APPLICATION OF WOUND VAC;  Surgeon: [name], MD;  Location: [facility];  Service: Orthopedics;;   BACK SURGERY     CARDIAC CATHETERIZATION N/A 03/02/2015   Procedure: Left Heart Cath and Coronary Angiography;  Surgeon: Belva Crome, MD;  Location: [facility];  Service: Cardiovascular;  Laterality: N/A;   CHOLECYSTECTOMY     ROTATOR CUFF REPAIR Right    TOTAL KNEE ARTHROPLASTY Left 03/15/2021  Procedure: LEFT TOTAL KNEE ARTHROPLASTY;  Surgeon: [name], MD;  Location: [facility];  Service: Orthopedics;  Laterality: Left;   Social History   Occupational History   Not on file  Tobacco Use   Smoking status: Never   Smokeless tobacco: Never  Vaping Use   Vaping Use: Never used  Substance and Sexual Activity   Alcohol use: No   Drug use: Never   Sexual activity: Not Currently

## 2021-04-25 ENCOUNTER — Encounter: Payer: Self-pay | Admitting: Orthopedic Surgery

## 2021-04-25 ENCOUNTER — Ambulatory Visit (INDEPENDENT_AMBULATORY_CARE_PROVIDER_SITE_OTHER): Payer: Self-pay | Admitting: Orthopedic Surgery

## 2021-04-25 DIAGNOSIS — Z96652 Presence of left artificial knee joint: Secondary | ICD-10-CM

## 2021-04-25 NOTE — Progress Notes (Signed)
Office Visit Note   Patient: Jenna Mccann           Date of Birth: Apr 24, 1961           MRN: [id number] Visit Date: 04/25/2021              Requested by: [place], O'Laf, PA-C [address] [place],  [place] [address] PCP: Sofie Rower, PA-C  Chief Complaint  Patient presents with   Left Knee - Follow-up      HPI: Patient is a 60 year old woman who is about 6 weeks status post left total knee arthroplasty.  Patient is currently been on doxycycline for a proximal stitch abscess.  Assessment & Plan: Visit Diagnoses:  1. History of total left knee replacement     Plan: We will have patient only take the doxycycline for a total of 10 days she will continue working on strengthening.  Follow-Up Instructions: Return in about 2 weeks (around 05/09/2021).   Ortho Exam  Patient is alert, oriented, no adenopathy, well-dressed, normal affect, normal respiratory effort. Examination patient has full active extension and flexion past 90 degrees of the left knee.  There is no effusion there is no cellulitis.  Proximally there appears to be a sterile stitch abscess where the fascial knot under the skin would be.  There is no drainage no cellulitis no clinical signs of infection.  Anticipate this should resolve uneventfully.  Patient has no pain with range of motion of her knee.  Imaging: No results found. No images are attached to the encounter.  Labs: Lab Results  Component Value Date   HGBA1C 5.1 03/03/2015     Lab Results  Component Value Date   ALBUMIN 3.6 11/20/2017    No results found for: MG No results found for: VD25OH  No results found for: PREALBUMIN CBC EXTENDED Latest Ref Rng & Units 03/11/2021 02/07/2021 08/02/2019  WBC 4.0 - 10.5 K/uL 6.3 6.4 6.5  RBC 3.87 - 5.11 MIL/uL 4.21 4.30 4.37  HGB 12.0 - 15.0 g/dL 12.1 12.4 12.7  HCT 36.0 - 46.0 % 37.4 38.2 39.6  PLT 150 - 400 K/uL 248 261 258     There is no height or weight on file to calculate  BMI.  Orders:  No orders of the defined types were placed in this encounter.  No orders of the defined types were placed in this encounter.    Procedures: No procedures performed  Clinical Data: No additional findings.  ROS:  All other systems negative, except as noted in the HPI. Review of Systems  Objective: Vital Signs: There were no vitals taken for this visit.  Specialty Comments:  No specialty comments available.  PMFS History: Patient Active Problem List   Diagnosis Date Noted   Arthritis of left knee 03/15/2021   Acute pericarditis 03/04/2015   Morbid obesity ([place]) 03/04/2015   Normocytic anemia 03/04/2015   Abnormal EKG 03/02/2015   Chest pain    Frequent UTI 07/05/2014   Incomplete emptying of bladder 07/05/2014   Dyslipidemia 06/29/2014   Tubular adenoma of colon 05/09/2014   Colon, diverticulosis 05/06/2014   Diverticulosis of large intestine without hemorrhage 05/06/2014   Morbid obesity with BMI of 40.0-44.9, adult ([place]) 03/27/2014   Past Medical History:  Diagnosis Date   Anxiety    Arthritis    Diverticulitis    History of cardiac catheterization    LHC during admit for pericarditis 8/16:  normal coronary arteries   History of echocardiogram  a. Echo 03/03/15:  mild to mod LVH, EF 55-60%, no RWMA, mildly dilated aortic root, mild LAE   Hypertension    Normocytic anemia    Pericarditis 02/26/2015    History reviewed. No pertinent family history.  Past Surgical History:  Procedure Laterality Date   ABDOMINAL HYSTERECTOMY     APPLICATION OF WOUND VAC  03/15/2021   Procedure: APPLICATION OF WOUND VAC;  Surgeon: [name], MD;  Location: [facility];  Service: Orthopedics;;   BACK SURGERY     CARDIAC CATHETERIZATION N/A 03/02/2015   Procedure: Left Heart Cath and Coronary Angiography;  Surgeon: Belva Crome, MD;  Location: [facility];  Service: Cardiovascular;  Laterality: N/A;   CHOLECYSTECTOMY     ROTATOR CUFF REPAIR Right    TOTAL  KNEE ARTHROPLASTY Left 03/15/2021   Procedure: LEFT TOTAL KNEE ARTHROPLASTY;  Surgeon: [name], MD;  Location: [facility];  Service: Orthopedics;  Laterality: Left;   Social History   Occupational History   Not on file  Tobacco Use   Smoking status: Never   Smokeless tobacco: Never  Vaping Use   Vaping Use: Never used  Substance and Sexual Activity   Alcohol use: No   Drug use: Never   Sexual activity: Not Currently

## 2021-05-01 ENCOUNTER — Ambulatory Visit (INDEPENDENT_AMBULATORY_CARE_PROVIDER_SITE_OTHER): Payer: Self-pay | Admitting: Family

## 2021-05-01 ENCOUNTER — Ambulatory Visit: Payer: Self-pay

## 2021-05-01 ENCOUNTER — Ambulatory Visit: Payer: Self-pay | Admitting: Family

## 2021-05-01 DIAGNOSIS — M25562 Pain in left knee: Secondary | ICD-10-CM

## 2021-05-01 DIAGNOSIS — M1712 Unilateral primary osteoarthritis, left knee: Secondary | ICD-10-CM

## 2021-05-01 NOTE — Progress Notes (Signed)
Post-Op Visit Note   Patient: Jenna Mccann           Date of Birth: Feb 14, 1961           MRN: [id number] Visit Date: 05/01/2021 PCP: Sofie Rower, PA-C  Chief Complaint:  Chief Complaint  Patient presents with   Left Knee - Routine Post Op    03/15/21 left total knee arthroplasty     HPI:  HPI Jenna Mccann is a 60 year old woman who presents status post left total knee arthroplasty she has been dealing with a suture abscess this has been gradually improving.  The area of the abscess is mildly tender with very mild surrounding erythema she is complaining of some new swelling to her knee.  She is having some swelling to the left of her proximal incision as well as over her anterior compartment of her shin these areas are tender and distressing to her  She feels she is having worse pain with weightbearing.  Ortho Exam On examination of the left knee there is no ligamentous laxity.  Her incision is well-healed.  The proximal suture does have an area of erythema with surrounding peeling skin I am unable to express any drainage there is no open area there is very mild warmth.  She does have some mild edema laterally to this this is tender for her she is having some swelling and tenderness over her anterior compartment there is no palpable fluctuance no cellulitis  Visit Diagnoses:  1. Acute pain of left knee   2. Arthritis of left knee     Plan: She will resume oral antibiotic therapy.  Follow-up in the office with Dr. Sharol Given in 1 week.  Radiographs reassuring.  Follow-Up Instructions: No follow-ups on file.   Imaging: No results found.  Orders:  Orders Placed This Encounter  Procedures   XR Knee 1-2 Views Left   No orders of the defined types were placed in this encounter.    PMFS History: Patient Active Problem List   Diagnosis Date Noted   Arthritis of left knee 03/15/2021   Acute pericarditis 03/04/2015   Morbid obesity ([place]) 03/04/2015   Normocytic anemia  03/04/2015   Abnormal EKG 03/02/2015   Chest pain    Frequent UTI 07/05/2014   Incomplete emptying of bladder 07/05/2014   Dyslipidemia 06/29/2014   Tubular adenoma of colon 05/09/2014   Colon, diverticulosis 05/06/2014   Diverticulosis of large intestine without hemorrhage 05/06/2014   Morbid obesity with BMI of 40.0-44.9, adult ([place]) 03/27/2014   Past Medical History:  Diagnosis Date   Anxiety    Arthritis    Diverticulitis    History of cardiac catheterization    LHC during admit for pericarditis 8/16:  normal coronary arteries   History of echocardiogram    a. Echo 03/03/15:  mild to mod LVH, EF 55-60%, no RWMA, mildly dilated aortic root, mild LAE   Hypertension    Normocytic anemia    Pericarditis 02/26/2015    No family history on file.  Past Surgical History:  Procedure Laterality Date   ABDOMINAL HYSTERECTOMY     APPLICATION OF WOUND VAC  03/15/2021   Procedure: APPLICATION OF WOUND VAC;  Surgeon: [name], MD;  Location: [facility];  Service: Orthopedics;;   BACK SURGERY     CARDIAC CATHETERIZATION N/A 03/02/2015   Procedure: Left Heart Cath and Coronary Angiography;  Surgeon: Belva Crome, MD;  Location: [facility];  Service: Cardiovascular;  Laterality: N/A;  CHOLECYSTECTOMY     ROTATOR CUFF REPAIR Right    TOTAL KNEE ARTHROPLASTY Left 03/15/2021   Procedure: LEFT TOTAL KNEE ARTHROPLASTY;  Surgeon: [name], MD;  Location: [facility];  Service: Orthopedics;  Laterality: Left;   Social History   Occupational History   Not on file  Tobacco Use   Smoking status: Never   Smokeless tobacco: Never  Vaping Use   Vaping Use: Never used  Substance and Sexual Activity   Alcohol use: No   Drug use: Never   Sexual activity: Not Currently

## 2021-05-02 ENCOUNTER — Telehealth: Payer: Self-pay | Admitting: Orthopedic Surgery

## 2021-05-02 NOTE — Telephone Encounter (Signed)
03/15/2021 left total knee replacement se below and advise.

## 2021-05-02 NOTE — Telephone Encounter (Signed)
Patient called needing Rx refilled Oxycodone. The number to contact patient is [phone]

## 2021-05-03 ENCOUNTER — Telehealth: Payer: Self-pay | Admitting: Orthopedic Surgery

## 2021-05-03 MED ORDER — OXYCODONE-ACETAMINOPHEN 5-325 MG PO TABS: 1.0000 | ORAL_TABLET | Freq: Three times a day (TID) | ORAL | 0 refills | Status: DC | PRN

## 2021-05-03 NOTE — Telephone Encounter (Signed)
Pt was seen by Junie Panning about her knee, She said that Junie Panning was suppose to call her after she spoke with Sharol Given. She has not received a call and is very worried that there is something wrong with her knee. "She wants something to be done before her knee gets messed up."  CB [phone]

## 2021-05-03 NOTE — Telephone Encounter (Signed)
Called pt and advised that  Dr. Sharol Given would like for her to come in so he can eval. Appt made for 05/07/21 3pm

## 2021-05-07 ENCOUNTER — Ambulatory Visit (INDEPENDENT_AMBULATORY_CARE_PROVIDER_SITE_OTHER): Payer: Self-pay | Admitting: Orthopedic Surgery

## 2021-05-07 ENCOUNTER — Other Ambulatory Visit: Payer: Self-pay

## 2021-05-07 DIAGNOSIS — Z96652 Presence of left artificial knee joint: Secondary | ICD-10-CM

## 2021-05-10 ENCOUNTER — Encounter: Payer: Self-pay | Admitting: Family

## 2021-05-22 ENCOUNTER — Encounter: Payer: Self-pay | Admitting: Orthopedic Surgery

## 2021-05-22 NOTE — Progress Notes (Signed)
Office Visit Note   Patient: Jenna Mccann           Date of Birth: 1960-09-02           MRN: [id number] Visit Date: 05/07/2021              Requested by: Sofie Rower, PA-C [place],  [place] [address] PCP: Sofie Rower, PA-C  Chief Complaint  Patient presents with   Left Knee - Routine Post Op    03/15/21 left total knee replacement       HPI: Patient is a 60 year old woman who presents 2 months status post left total knee arthroplasty.  Patient did have a proximal sterile stitch abscess that was treated with doxycycline.  Patient is concerned with swelling.  Assessment & Plan: Visit Diagnoses:  1. History of total left knee replacement     Plan: Recommended continue strengthening.  Recommended scar massage.  Continue working on range of motion.  Follow-Up Instructions: Return in about 4 weeks (around 06/04/2021).   Ortho Exam  Patient is alert, oriented, no adenopathy, well-dressed, normal affect, normal respiratory effort. Examination the stitch abscess has resolved there is no redness no draining no tenderness to palpation the patella tracks midline she has range of motion from 0 to 100 degrees she has some pain over the medial joint line.  Imaging: No results found. No images are attached to the encounter.  Labs: Lab Results  Component Value Date   HGBA1C 5.1 03/03/2015     Lab Results  Component Value Date   ALBUMIN 3.6 11/20/2017    No results found for: MG No results found for: VD25OH  No results found for: PREALBUMIN CBC EXTENDED Latest Ref Rng & Units 03/11/2021 02/07/2021 08/02/2019  WBC 4.0 - 10.5 K/uL 6.3 6.4 6.5  RBC 3.87 - 5.11 MIL/uL 4.21 4.30 4.37  HGB 12.0 - 15.0 g/dL 12.1 12.4 12.7  HCT 36.0 - 46.0 % 37.4 38.2 39.6  PLT 150 - 400 K/uL 248 261 258     There is no height or weight on file to calculate BMI.  Orders:  No orders of the defined types were placed in this encounter.  No orders of the defined  types were placed in this encounter.    Procedures: No procedures performed  Clinical Data: No additional findings.  ROS:  All other systems negative, except as noted in the HPI. Review of Systems  Objective: Vital Signs: There were no vitals taken for this visit.  Specialty Comments:  No specialty comments available.  PMFS History: Patient Active Problem List   Diagnosis Date Noted   Arthritis of left knee 03/15/2021   Acute pericarditis 03/04/2015   Morbid obesity ([place]) 03/04/2015   Normocytic anemia 03/04/2015   Abnormal EKG 03/02/2015   Chest pain    Frequent UTI 07/05/2014   Incomplete emptying of bladder 07/05/2014   Dyslipidemia 06/29/2014   Tubular adenoma of colon 05/09/2014   Colon, diverticulosis 05/06/2014   Diverticulosis of large intestine without hemorrhage 05/06/2014   Morbid obesity with BMI of 40.0-44.9, adult ([place]) 03/27/2014   Past Medical History:  Diagnosis Date   Anxiety    Arthritis    Diverticulitis    History of cardiac catheterization    LHC during admit for pericarditis 8/16:  normal coronary arteries   History of echocardiogram    a. Echo 03/03/15:  mild to mod LVH, EF 55-60%, no RWMA, mildly dilated aortic root, mild LAE  Hypertension    Normocytic anemia    Pericarditis 02/26/2015    History reviewed. No pertinent family history.  Past Surgical History:  Procedure Laterality Date   ABDOMINAL HYSTERECTOMY     APPLICATION OF WOUND VAC  03/15/2021   Procedure: APPLICATION OF WOUND VAC;  Surgeon: [name], MD;  Location: [facility];  Service: Orthopedics;;   BACK SURGERY     CARDIAC CATHETERIZATION N/A 03/02/2015   Procedure: Left Heart Cath and Coronary Angiography;  Surgeon: Belva Crome, MD;  Location: [facility];  Service: Cardiovascular;  Laterality: N/A;   CHOLECYSTECTOMY     ROTATOR CUFF REPAIR Right    TOTAL KNEE ARTHROPLASTY Left 03/15/2021   Procedure: LEFT TOTAL KNEE ARTHROPLASTY;  Surgeon: [name], MD;   Location: [facility];  Service: Orthopedics;  Laterality: Left;   Social History   Occupational History   Not on file  Tobacco Use   Smoking status: Never   Smokeless tobacco: Never  Vaping Use   Vaping Use: Never used  Substance and Sexual Activity   Alcohol use: No   Drug use: Never   Sexual activity: Not Currently

## 2021-06-04 ENCOUNTER — Ambulatory Visit: Payer: Self-pay | Admitting: Orthopedic Surgery

## 2021-06-13 ENCOUNTER — Ambulatory Visit: Payer: Self-pay | Admitting: Orthopedic Surgery

## 2021-06-25 ENCOUNTER — Other Ambulatory Visit: Payer: Self-pay

## 2021-06-25 ENCOUNTER — Ambulatory Visit (INDEPENDENT_AMBULATORY_CARE_PROVIDER_SITE_OTHER): Payer: Self-pay | Admitting: Orthopedic Surgery

## 2021-06-25 DIAGNOSIS — M1711 Unilateral primary osteoarthritis, right knee: Secondary | ICD-10-CM

## 2021-06-25 DIAGNOSIS — Z96652 Presence of left artificial knee joint: Secondary | ICD-10-CM

## 2021-06-26 ENCOUNTER — Encounter: Payer: Self-pay | Admitting: Orthopedic Surgery

## 2021-06-26 DIAGNOSIS — M1711 Unilateral primary osteoarthritis, right knee: Secondary | ICD-10-CM

## 2021-06-26 MED ORDER — LIDOCAINE HCL (PF) 1 % IJ SOLN
5.0000 mL | INTRAMUSCULAR | Status: AC | PRN
Start: 2021-06-26 — End: 2021-06-26
  Administered 2021-06-26: 5 mL

## 2021-06-26 MED ORDER — METHYLPREDNISOLONE ACETATE 40 MG/ML IJ SUSP
40.0000 mg | INTRAMUSCULAR | Status: AC | PRN
  Administered 2021-06-26: 40 mg via INTRA_ARTICULAR

## 2021-06-26 NOTE — Progress Notes (Signed)
Office Visit Note   Patient: Jenna Mccann           Date of Birth: [birth year]           MRN: [id number] Visit Date: 06/25/2021              Requested by: Sofie Rower, PA-C [address],  [place] [address] PCP: Sofie Rower, PA-C  Chief Complaint  Patient presents with   Left Knee - Follow-up    Left tka 03/15/21      HPI: Patient is a 60 year old woman who is over 3 months out from a left total knee arthroplasty she is working on range of motion strengthening and scar massage.  She states that she can do anything now with range of motion and strength.  Patient states that she has had some increasing pain in the right knee she states it feels like it slipping out of place.  Has had episodes where she feels like she almost fell.  Assessment & Plan: Visit Diagnoses:  1. History of total left knee replacement   2. Arthritis of right knee     Plan: Right knee was injected she tolerated this well plan to follow-up after the first of the year  Follow-Up Instructions: Return in about 4 weeks (around 07/23/2021).   Ortho Exam  Patient is alert, oriented, no adenopathy, well-dressed, normal affect, normal respiratory effort. Examination patient has full range of motion of the left knee no redness no cellulitis.  Examination the right knee there is crepitation with range of motion she is tender to palpation the patellofemoral joint as well as medial and lateral joint line.  Imaging: No results found. No images are attached to the encounter.  Labs: Lab Results  Component Value Date   HGBA1C 5.1 03/03/2015     Lab Results  Component Value Date   ALBUMIN 3.6 11/20/2017    No results found for: MG No results found for: VD25OH  No results found for: PREALBUMIN CBC EXTENDED Latest Ref Rng & Units 03/11/2021 02/07/2021 08/02/2019  WBC 4.0 - 10.5 K/uL 6.3 6.4 6.5  RBC 3.87 - 5.11 MIL/uL 4.21 4.30 4.37  HGB 12.0 - 15.0 g/dL 12.1 12.4 12.7  HCT 36.0 -  46.0 % 37.4 38.2 39.6  PLT 150 - 400 K/uL 248 261 258     There is no height or weight on file to calculate BMI.  Orders:  No orders of the defined types were placed in this encounter.  No orders of the defined types were placed in this encounter.    Procedures: Large Joint Inj: R knee on 06/26/2021 4:12 PM Indications: pain and diagnostic evaluation Details: 22 G 1.5 in needle, anteromedial approach  Arthrogram: No  Medications: 5 mL lidocaine (PF) 1 %; 40 mg methylPREDNISolone acetate 40 MG/ML Outcome: tolerated well, no immediate complications Procedure, treatment alternatives, risks and benefits explained, specific risks discussed. Consent was given by the patient. Immediately prior to procedure a time out was called to verify the correct patient, procedure, equipment, support staff and site/side marked as required. Patient was prepped and draped in the usual sterile fashion.     Clinical Data: No additional findings.  ROS:  All other systems negative, except as noted in the HPI. Review of Systems  Objective: Vital Signs: There were no vitals taken for this visit.  Specialty Comments:  No specialty comments available.  PMFS History: Patient Active Problem List   Diagnosis Date Noted  Arthritis of left knee 03/15/2021   Acute pericarditis 03/04/2015   Morbid obesity ([place]) 03/04/2015   Normocytic anemia 03/04/2015   Abnormal EKG 03/02/2015   Chest pain    Frequent UTI 07/05/2014   Incomplete emptying of bladder 07/05/2014   Dyslipidemia 06/29/2014   Tubular adenoma of colon 05/09/2014   Colon, diverticulosis 05/06/2014   Diverticulosis of large intestine without hemorrhage 05/06/2014   Morbid obesity with BMI of 40.0-44.9, adult ([place]) 03/27/2014   Past Medical History:  Diagnosis Date   Anxiety    Arthritis    Diverticulitis    History of cardiac catheterization    LHC during admit for pericarditis 8/16:  normal coronary arteries   History of  echocardiogram    a. Echo 03/03/15:  mild to mod LVH, EF 55-60%, no RWMA, mildly dilated aortic root, mild LAE   Hypertension    Normocytic anemia    Pericarditis 02/26/2015    History reviewed. No pertinent family history.  Past Surgical History:  Procedure Laterality Date   ABDOMINAL HYSTERECTOMY     APPLICATION OF WOUND VAC  03/15/2021   Procedure: APPLICATION OF WOUND VAC;  Surgeon: [name], MD;  Location: [facility];  Service: Orthopedics;;   BACK SURGERY     CARDIAC CATHETERIZATION N/A 03/02/2015   Procedure: Left Heart Cath and Coronary Angiography;  Surgeon: Belva Crome, MD;  Location: [facility];  Service: Cardiovascular;  Laterality: N/A;   CHOLECYSTECTOMY     ROTATOR CUFF REPAIR Right    TOTAL KNEE ARTHROPLASTY Left 03/15/2021   Procedure: LEFT TOTAL KNEE ARTHROPLASTY;  Surgeon: [name], MD;  Location: [facility];  Service: Orthopedics;  Laterality: Left;   Social History   Occupational History   Not on file  Tobacco Use   Smoking status: Never   Smokeless tobacco: Never  Vaping Use   Vaping Use: Never used  Substance and Sexual Activity   Alcohol use: No   Drug use: Never   Sexual activity: Not Currently

## 2022-05-25 IMAGING — MR MR KNEE*L* W/O CM
6 series · 40 of 40 positions shown · non-contrast
Comparison: Plain films left knee 02/23/2020.

CLINICAL DATA: Left knee pain for 6 months.  No known injury.

EXAM:
MRI OF THE LEFT KNEE WITHOUT CONTRAST
TECHNIQUE: Multiplanar, multisequence MR imaging of the knee was performed. No
intravenous contrast was administered.

[Series 32: PD fat-sat · sagittal · left · 3.0mm · 0.78mm/px · 7 of 32 slices shown]
[im 1/32]
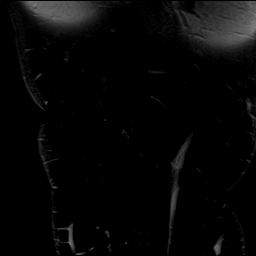
[im 6/32]
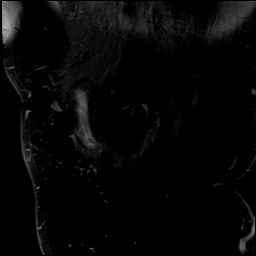
[im 11/32]
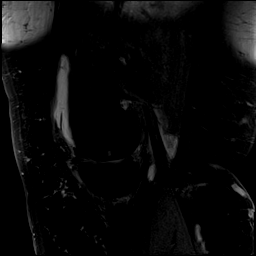
[im 16/32]
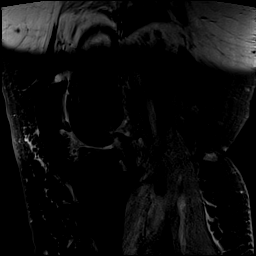
[im 21/32]
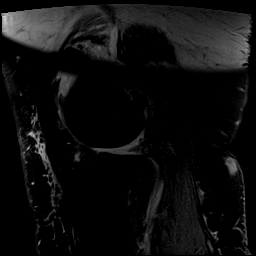
[im 26/32]
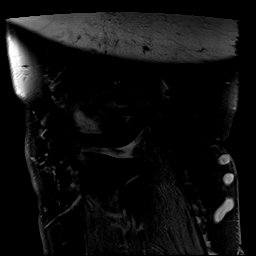
[im 32/32]
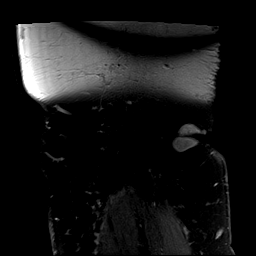

[Series 33: T2 fat-sat · sagittal · left · 3.0mm · 0.78mm/px · 6 of 28 slices shown]
[im 1/28]
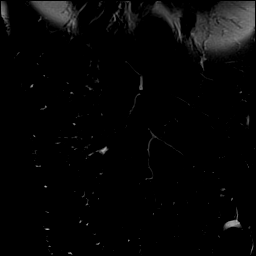
[im 6/28]
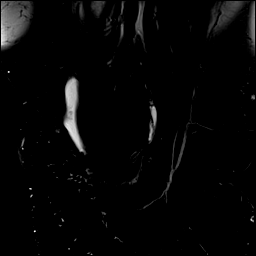
[im 11/28]
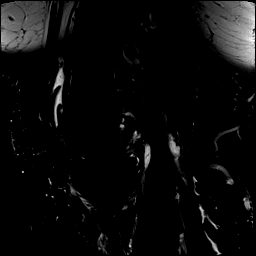
[im 17/28]
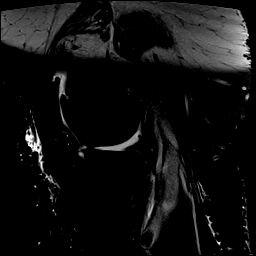
[im 22/28]
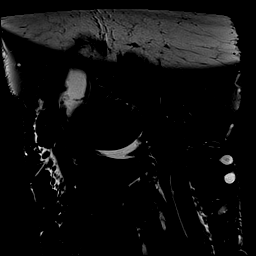
[im 28/28]
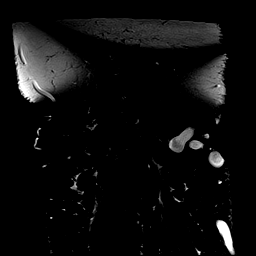

[Series 1001: T2 · axial · left · 4.0mm · 0.62mm/px · z∈[+13,+135]mm · 6 of 26 slices shown (1 of 2)]
[im 1/26]
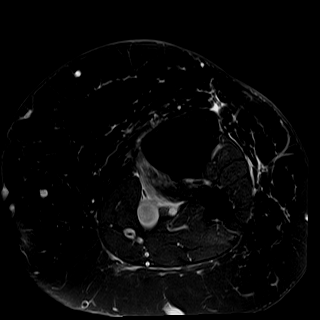
[im 6/26]
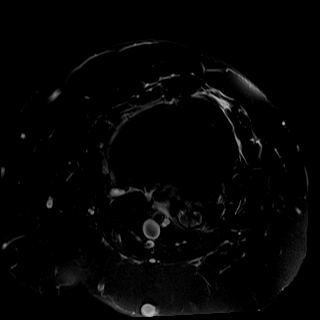
[im 11/26]
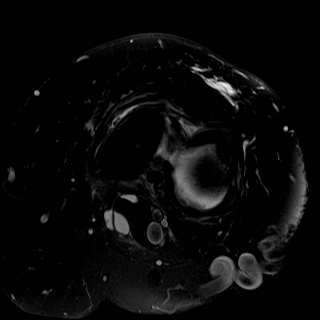
[im 16/26]
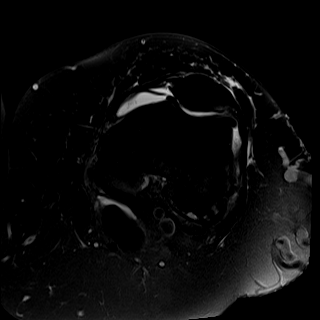
[im 21/26]
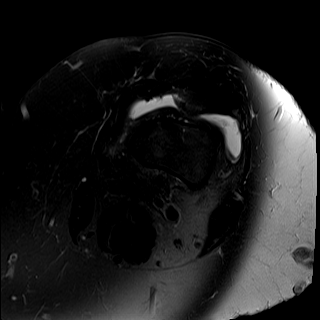
[im 26/26]
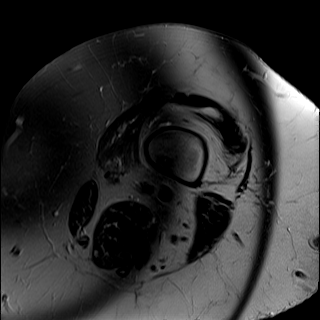

[Series 1007: cor t1_new · coronal · left · 4.0mm · 0.59mm/px · 7 of 28 slices shown]
[im 1/28]
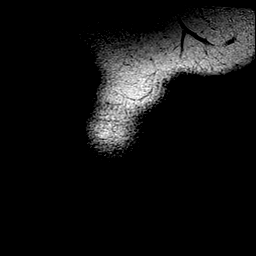
[im 5/28]
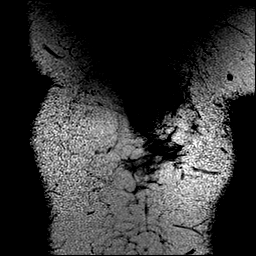
[im 10/28]
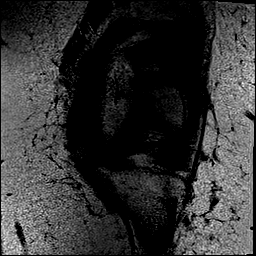
[im 14/28]
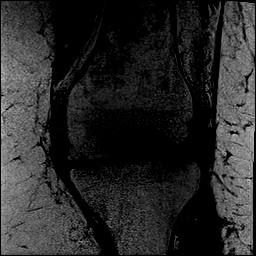
[im 19/28]
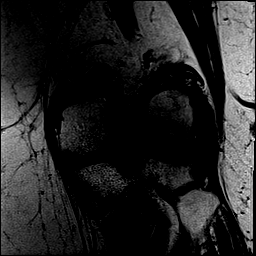
[im 23/28]
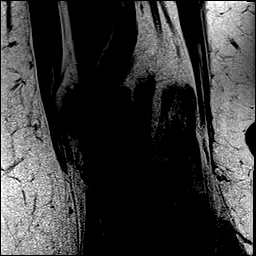
[im 28/28]
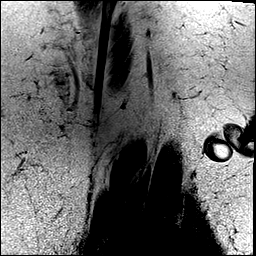

[Series 1014: T2 · coronal · left · 4.0mm · 0.78mm/px · 7 of 28 slices shown (2 of 2)]
[im 1/28]
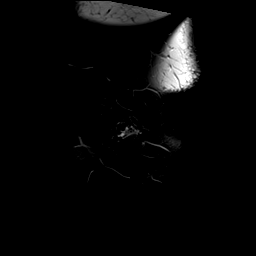
[im 5/28]
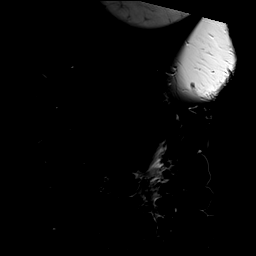
[im 10/28]
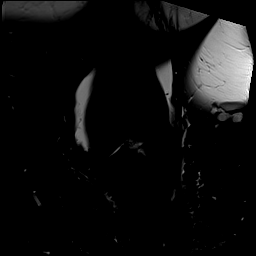
[im 14/28]
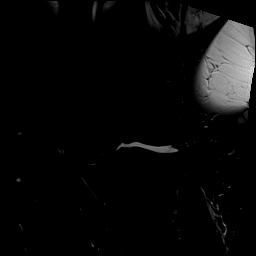
[im 19/28]
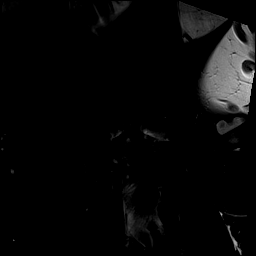
[im 23/28]
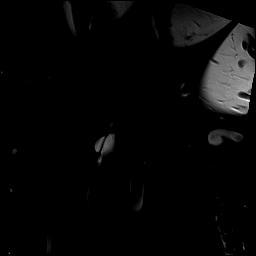
[im 28/28]
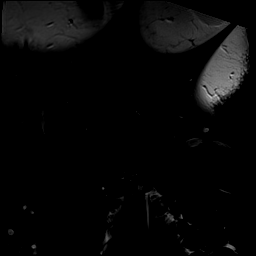

[Series 1021: PD · coronal · left · 4.0mm · 0.78mm/px · 7 of 28 slices shown]
[im 1/28]
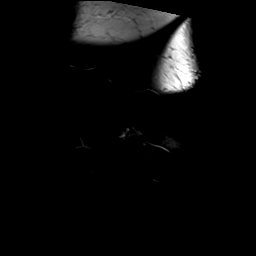
[im 5/28]
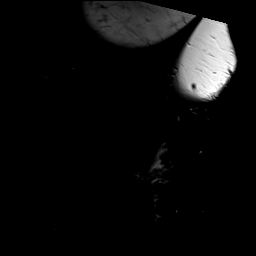
[im 10/28]
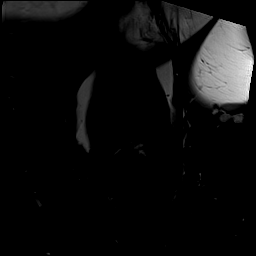
[im 14/28]
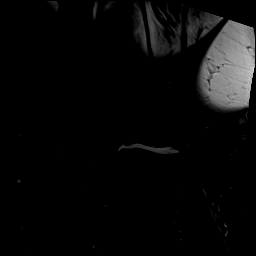
[im 19/28]
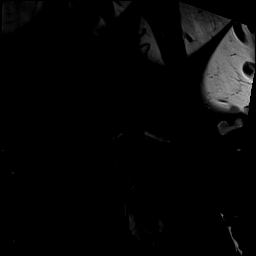
[im 23/28]
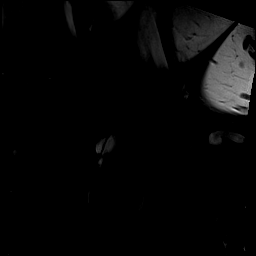
[im 28/28]
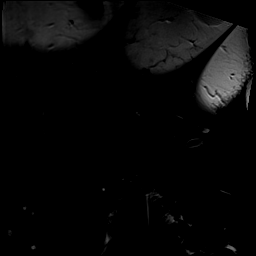

[40 of 40 positions shown; findings below may reference images not displayed]

FINDINGS: This examination is limited as the standard knee coil could not be
used secondary to the patient's body habitus.

MENISCI

Medial meniscus: The posterior horn and body are markedly
degenerated and diminutive without tear identified.

Lateral meniscus: No normal-appearing meniscal tissue is seen in the
posterior horn and majority of the body consistent with degenerative
maceration.

LIGAMENTS

Cruciates:  Chronic, complete ACL tear.  PCL is intact.

Collaterals:  Intact.

CARTILAGE

Patellofemoral:  Degenerated throughout.

Medial:  Denuded throughout.

Lateral:  Degenerated throughout.

Joint:  Small joint effusion.

Popliteal Fossa:  Small Baker's cyst.

Extensor Mechanism:  Intact.

Bones: Bulky osteophytes are present about all 3 compartments. No
fracture, stress change or worrisome lesion.

Other: None.
IMPRESSION: Dominant finding is severe osteoarthritis about the knee which is
worst in the lateral compartment.

Degenerative maceration posterior horn and body of the lateral
meniscus.

Chronic, complete ACL tear.

Small Baker's cyst.

## 2022-08-06 DIAGNOSIS — G589 Mononeuropathy, unspecified: Secondary | ICD-10-CM | POA: Diagnosis not present

## 2022-08-06 DIAGNOSIS — M1711 Unilateral primary osteoarthritis, right knee: Secondary | ICD-10-CM | POA: Diagnosis not present

## 2022-08-06 DIAGNOSIS — E785 Hyperlipidemia, unspecified: Secondary | ICD-10-CM | POA: Diagnosis not present

## 2022-08-06 DIAGNOSIS — Z1239 Encounter for other screening for malignant neoplasm of breast: Secondary | ICD-10-CM | POA: Diagnosis not present

## 2022-08-13 ENCOUNTER — Other Ambulatory Visit (HOSPITAL_COMMUNITY): Payer: Self-pay | Admitting: Physician Assistant

## 2022-08-13 DIAGNOSIS — R609 Edema, unspecified: Secondary | ICD-10-CM

## 2022-08-13 DIAGNOSIS — R600 Localized edema: Secondary | ICD-10-CM | POA: Diagnosis not present

## 2022-08-13 DIAGNOSIS — M79609 Pain in unspecified limb: Secondary | ICD-10-CM | POA: Diagnosis not present

## 2022-08-13 DIAGNOSIS — M79662 Pain in left lower leg: Secondary | ICD-10-CM

## 2022-08-21 ENCOUNTER — Ambulatory Visit (HOSPITAL_COMMUNITY)
Admission: RE | Admit: 2022-08-21 | Discharge: 2022-08-21 | Disposition: A | Discharge status: Home / Self Care | Payer: Medicaid Other | Source: Ambulatory Visit | Attending: Physician Assistant | Admitting: Physician Assistant

## 2022-08-21 DIAGNOSIS — R609 Edema, unspecified: Secondary | ICD-10-CM | POA: Insufficient documentation

## 2022-08-21 DIAGNOSIS — M79662 Pain in left lower leg: Secondary | ICD-10-CM | POA: Insufficient documentation

## 2022-09-25 ENCOUNTER — Encounter: Payer: Self-pay | Admitting: Radiology

## 2022-09-26 DIAGNOSIS — Z419 Encounter for procedure for purposes other than remedying health state, unspecified: Secondary | ICD-10-CM | POA: Diagnosis not present

## 2022-10-16 ENCOUNTER — Other Ambulatory Visit (INDEPENDENT_AMBULATORY_CARE_PROVIDER_SITE_OTHER): Payer: Medicaid Other

## 2022-10-16 ENCOUNTER — Encounter: Payer: Self-pay | Admitting: Orthopedic Surgery

## 2022-10-16 ENCOUNTER — Ambulatory Visit (INDEPENDENT_AMBULATORY_CARE_PROVIDER_SITE_OTHER): Payer: Medicaid Other | Admitting: Orthopedic Surgery

## 2022-10-16 VITALS — BP 155/93 | Ht 66.75 in | Wt 311.6 lb

## 2022-10-16 DIAGNOSIS — M25561 Pain in right knee: Secondary | ICD-10-CM

## 2022-10-16 DIAGNOSIS — M1711 Unilateral primary osteoarthritis, right knee: Secondary | ICD-10-CM | POA: Diagnosis not present

## 2022-10-16 DIAGNOSIS — G8929 Other chronic pain: Secondary | ICD-10-CM | POA: Diagnosis not present

## 2022-10-16 NOTE — Progress Notes (Signed)
Office Visit Note   Patient: Jenna Mccann           Date of Birth: 11/09/1960           MRN: [id number] Visit Date: 10/16/2022              Requested by: [place], O'Laf, PA-C [facility],  [place] [address] PCP: Sofie Rower, PA-C  Chief Complaint  Patient presents with   Right Knee - Pain      HPI: Patient is a 62 year old woman who is seen for evaluation for osteoarthritis right knee.  Patient is status post a left total knee arthroplasty and she states her left knee is doing great.  She is status post a steroid injection in her right knee November 2022 without much relief.  Assessment & Plan: Visit Diagnoses:  1. Chronic pain of right knee   2. Arthritis of right knee     Plan: Patient states she would like to proceed with total knee arthroplasty on the right risk and benefits were discussed patient states she understands.  Patient states that she will try to lose 70 pounds in the next 2 months.  Follow-up in 2 months.  Follow-Up Instructions: Return in about 2 months (around 12/16/2022).   Ortho Exam  Patient is alert, oriented, no adenopathy, well-dressed, normal affect, normal respiratory effort. Examination patient has an antalgic gait she walks with a stifflegged gait on the right.  She has tenderness to palpation of the medial lateral joint line as well as the patellofemoral joint.  She has active range of motion from 0 to 90 degrees.  Blood pressure 155/93.  Height 5 foot 6-3/4.  Weight 311 pounds.  Imaging: XR Knee 1-2 Views Right  Result Date: 10/16/2022 2 view radiographs of the right knee shows valgus alignment with tricompartmental arthritic changes with bony spurs subchondral cysts and subchondral or sclerosis.  No images are attached to the encounter.  Labs: Lab Results  Component Value Date   HGBA1C 5.1 03/03/2015     Lab Results  Component Value Date   ALBUMIN 3.6 11/20/2017    No results found for: "MG" No results  found for: "VD25OH"  No results found for: "PREALBUMIN"    Latest Ref Rng & Units 03/11/2021   11:11 AM 02/07/2021    2:00 PM 08/02/2019    9:47 PM  CBC EXTENDED  WBC 4.0 - 10.5 K/uL 6.3  6.4  6.5   RBC 3.87 - 5.11 MIL/uL 4.21  4.30  4.37   Hemoglobin 12.0 - 15.0 g/dL 12.1  12.4  12.7   HCT 36.0 - 46.0 % 37.4  38.2  39.6   Platelets 150 - 400 K/uL 248  261  258      Body mass index is 49.17 kg/m.  Orders:  Orders Placed This Encounter  Procedures   XR Knee 1-2 Views Right   No orders of the defined types were placed in this encounter.    Procedures: No procedures performed  Clinical Data: No additional findings.  ROS:  All other systems negative, except as noted in the HPI. Review of Systems  Objective: Vital Signs: BP (!) 155/93   Ht 5' 6.75" (1.695 m)   Wt (!) 311 lb 9.6 oz (141.3 kg)   BMI 49.17 kg/m   Specialty Comments:  No specialty comments available.  PMFS History: Patient Active Problem List   Diagnosis Date Noted   Arthritis of left knee 03/15/2021   Acute  pericarditis 03/04/2015   Morbid obesity ([place]) 03/04/2015   Normocytic anemia 03/04/2015   Abnormal EKG 03/02/2015   Chest pain    Frequent UTI 07/05/2014   Incomplete emptying of bladder 07/05/2014   Dyslipidemia 06/29/2014   Tubular adenoma of colon 05/09/2014   Colon, diverticulosis 05/06/2014   Diverticulosis of large intestine without hemorrhage 05/06/2014   Morbid obesity with BMI of 40.0-44.9, adult ([place]) 03/27/2014   Past Medical History:  Diagnosis Date   Anxiety    Arthritis    Diverticulitis    History of cardiac catheterization    LHC during admit for pericarditis 8/16:  normal coronary arteries   History of echocardiogram    a. Echo 03/03/15:  mild to mod LVH, EF 55-60%, no RWMA, mildly dilated aortic root, mild LAE   Hypertension    Normocytic anemia    Pericarditis 02/26/2015    History reviewed. No pertinent family history.  Past Surgical History:  Procedure  Laterality Date   ABDOMINAL HYSTERECTOMY     APPLICATION OF WOUND VAC  03/15/2021   Procedure: APPLICATION OF WOUND VAC;  Surgeon: [name], MD;  Location: [facility];  Service: Orthopedics;;   BACK SURGERY     CARDIAC CATHETERIZATION N/A 03/02/2015   Procedure: Left Heart Cath and Coronary Angiography;  Surgeon: Belva Crome, MD;  Location: [facility];  Service: Cardiovascular;  Laterality: N/A;   CHOLECYSTECTOMY     ROTATOR CUFF REPAIR Right    TOTAL KNEE ARTHROPLASTY Left 03/15/2021   Procedure: LEFT TOTAL KNEE ARTHROPLASTY;  Surgeon: [name], MD;  Location: [facility];  Service: Orthopedics;  Laterality: Left;   Social History   Occupational History   Not on file  Tobacco Use   Smoking status: Never   Smokeless tobacco: Never  Vaping Use   Vaping Use: Never used  Substance and Sexual Activity   Alcohol use: No   Drug use: Never   Sexual activity: Not Currently

## 2022-10-30 DIAGNOSIS — E785 Hyperlipidemia, unspecified: Secondary | ICD-10-CM | POA: Diagnosis not present

## 2022-10-30 DIAGNOSIS — M159 Polyosteoarthritis, unspecified: Secondary | ICD-10-CM | POA: Diagnosis not present

## 2022-10-30 DIAGNOSIS — G5603 Carpal tunnel syndrome, bilateral upper limbs: Secondary | ICD-10-CM | POA: Diagnosis not present

## 2022-10-30 DIAGNOSIS — Z131 Encounter for screening for diabetes mellitus: Secondary | ICD-10-CM | POA: Diagnosis not present

## 2022-10-30 DIAGNOSIS — E559 Vitamin D deficiency, unspecified: Secondary | ICD-10-CM | POA: Diagnosis not present

## 2022-12-29 ENCOUNTER — Ambulatory Visit: Payer: Medicaid Other | Admitting: Orthopedic Surgery

## 2023-03-09 ENCOUNTER — Ambulatory Visit: Payer: Medicaid Other | Admitting: Orthopedic Surgery

## 2023-03-31 ENCOUNTER — Encounter: Payer: Medicaid Other | Admitting: Family Medicine

## 2023-04-06 ENCOUNTER — Telehealth (INDEPENDENT_AMBULATORY_CARE_PROVIDER_SITE_OTHER): Payer: Self-pay | Admitting: Family Medicine

## 2023-04-06 NOTE — Telephone Encounter (Signed)
I called the patient about the MyChart they sent and they mentioned they'd like to reschedule their information session at [facility]. A voicemail has been left.

## 2023-04-13 ENCOUNTER — Encounter: Payer: Medicaid Other | Admitting: Family Medicine

## 2023-04-16 ENCOUNTER — Encounter: Payer: Self-pay | Admitting: Family Medicine

## 2023-04-16 ENCOUNTER — Ambulatory Visit (INDEPENDENT_AMBULATORY_CARE_PROVIDER_SITE_OTHER): Payer: Medicaid Other | Admitting: Family Medicine

## 2023-04-16 VITALS — BP 153/77 | HR 81 | Temp 98.4°F | Ht 64.5 in | Wt 308.0 lb

## 2023-04-16 DIAGNOSIS — Z6841 Body Mass Index (BMI) 40.0 and over, adult: Secondary | ICD-10-CM | POA: Diagnosis not present

## 2023-04-16 DIAGNOSIS — M170 Bilateral primary osteoarthritis of knee: Secondary | ICD-10-CM | POA: Diagnosis not present

## 2023-04-16 DIAGNOSIS — I10 Essential (primary) hypertension: Secondary | ICD-10-CM | POA: Insufficient documentation

## 2023-04-16 NOTE — Assessment & Plan Note (Signed)
BP is elevated today She takes Olmesartan 40 mg daily Denies CP or HA  Look for improvements with weight reduction

## 2023-04-16 NOTE — Assessment & Plan Note (Signed)
S/p L TKR with Dr Lajoyce Corners, needing R TKR with BMI <40 Did well with L TKR without problem and did her own rehab The lack of physical activity with worsening R knee pain has contributed more to weight gain She chooses short walks for exercise given the lack of nearby gyms or pools for alternative options

## 2023-04-16 NOTE — Progress Notes (Signed)
Office: [phone]  /  Fax: [phone]   Initial Visit  Jenna Mccann was seen in clinic today to evaluate for obesity. She is interested in losing weight to improve overall health and reduce the risk of weight related complications. She presents today to review program treatment options, initial physical assessment, and evaluation.     She was referred by: PCP  When asked what else they would like to accomplish? She states: Improve energy levels and physical activity, Improve existing medical conditions, Improve quality of life, and Improve appearance  Weight history:  needs to lose weight to have R TKR; R knee pain has limited exercise; would like to get back down to 245 lb.  When asked how has your weight affected you? She states: Contributed to medical problems, Contributed to orthopedic problems or mobility issues, and Having fatigue  Some associated conditions: Hypertension and Arthritis:knees  Contributing factors: Stress and Reduced physical activity  Weight promoting medications identified: None  Current nutrition plan: None  Current level of physical activity: NEAT  Current or previous pharmacotherapy: Phentermine  Response to medication: Lost weight initially but was unable to sustain weight loss   Past medical history includes:   Past Medical History:  Diagnosis Date   Anxiety    Arthritis    Diverticulitis    History of cardiac catheterization    LHC during admit for pericarditis 8/16:  normal coronary arteries   History of echocardiogram    a. Echo 03/03/15:  mild to mod LVH, EF 55-60%, no RWMA, mildly dilated aortic root, mild LAE   Hypertension    Normocytic anemia    Pericarditis 02/26/2015     Objective:   BP (!) 153/77   Pulse 81   Temp 98.4 F (36.9 C)   Ht 5' 4.5" (1.638 m)   Wt (!) 308 lb (139.7 kg)   SpO2 96%   BMI 52.05 kg/m  She was weighed on the bioimpedance scale: Body mass index is 52.05 kg/m.  Peak Weight:308 , Body  Fat%:58.9, Visceral Fat Rating:23, Weight trend over the last 12 months: Increasing  General:  Alert, oriented and cooperative. Patient is in no acute distress.  Respiratory: Normal respiratory effort, no problems with respiration noted   Gait: able to ambulate independently  Mental Status: Normal mood and affect. Normal behavior. Normal judgment and thought content.   DIAGNOSTIC DATA REVIEWED:  BMET    Component Value Date/Time   NA 137 03/11/2021 1111   K 3.7 03/11/2021 1111   CL 102 03/11/2021 1111   CO2 29 03/11/2021 1111   GLUCOSE 90 03/11/2021 1111   BUN 18 03/11/2021 1111   CREATININE 0.94 03/11/2021 1111   CALCIUM 9.0 03/11/2021 1111   GFRNONAA >60 03/11/2021 1111   GFRAA >60 08/02/2019 2147   Lab Results  Component Value Date   HGBA1C 5.1 03/03/2015   No results found for: "INSULIN" CBC    Component Value Date/Time   WBC 6.3 03/11/2021 1111   RBC 4.21 03/11/2021 1111   HGB 12.1 03/11/2021 1111   HCT 37.4 03/11/2021 1111   PLT 248 03/11/2021 1111   MCV 88.8 03/11/2021 1111   MCH 28.7 03/11/2021 1111   MCHC 32.4 03/11/2021 1111   RDW 13.0 03/11/2021 1111   Iron/TIBC/Ferritin/ %Sat No results found for: "IRON", "TIBC", "FERRITIN", "IRONPCTSAT" Lipid Panel  No results found for: "CHOL", "TRIG", "HDL", "CHOLHDL", "VLDL", "LDLCALC", "LDLDIRECT" Hepatic Function Panel     Component Value Date/Time   PROT 6.7 11/20/2017 0034  ALBUMIN 3.6 11/20/2017 0034   AST 89 (H) 11/20/2017 0034   ALT 52 11/20/2017 0034   ALKPHOS 62 11/20/2017 0034   BILITOT 0.9 11/20/2017 0034   BILIDIR 0.3 11/20/2017 0034   IBILI 0.6 11/20/2017 0034      Component Value Date/Time   TSH 2.831 03/02/2015 1533     Assessment and Plan:   Primary osteoarthritis of both knees Assessment & Plan: S/p L TKR with Dr Lajoyce Corners, needing R TKR with BMI <40 Did well with L TKR without problem and did her own rehab The lack of physical activity with worsening R knee pain has contributed more to  weight gain She chooses short walks for exercise given the lack of nearby gyms or pools for alternative options    Morbid obesity ([facility])  BMI 50.0-59.9, adult ([facility])  Primary hypertension Assessment & Plan: BP is elevated today She takes Olmesartan 40 mg daily Denies CP or HA  Look for improvements with weight reduction          Obesity Treatment / Action Plan:  Patient will work on garnering support from family and friends to begin weight loss journey. Will work on eliminating or reducing the presence of highly palatable, calorie dense foods in the home. Will complete provided nutritional and psychosocial assessment questionnaire before the next appointment. Will be scheduled for indirect calorimetry to determine resting energy expenditure in a fasting state.  This will allow [place] to create a reduced calorie, high-protein meal plan to promote loss of fat mass while preserving muscle mass. Will think about ideas on how to incorporate physical activity into their daily routine. Will work on reading labels, making healthier choices and watching portion sizes. Counseled on the health benefits of losing 5%-15% of total body weight. Was counseled on nutritional approaches to weight loss and benefits of reducing processed foods and consuming plant-based foods and high quality protein as part of nutritional weight management. Was counseled on pharmacotherapy and role as an adjunct in weight management.   Obesity Education Performed Today:  She was weighed on the bioimpedance scale and results were discussed and documented in the synopsis.  We discussed obesity as a disease and the importance of a more detailed evaluation of all the factors contributing to the disease.  We discussed the importance of long term lifestyle changes which include nutrition, exercise and behavioral modifications as well as the importance of customizing this to her specific health and social needs.  We  discussed the benefits of reaching a healthier weight to alleviate the symptoms of existing conditions and reduce the risks of the biomechanical, metabolic and psychological effects of obesity.  Jenna Mccann appears to be in the action stage of change and states they are ready to start intensive lifestyle modifications and behavioral modifications.  30 minutes was spent today on this visit including the above counseling, pre-visit chart review, and post-visit documentation.  Reviewed by clinician on day of visit: allergies, medications, problem list, medical history, surgical history, family history, social history, and previous encounter notes pertinent to obesity diagnosis.    Seymour Bars, D.O. DABFM, DABOM [facility] [phone] [place], [place] [address] [phone]

## 2023-04-28 ENCOUNTER — Ambulatory Visit (INDEPENDENT_AMBULATORY_CARE_PROVIDER_SITE_OTHER): Payer: Medicaid Other | Admitting: Family Medicine

## 2023-04-28 ENCOUNTER — Encounter: Payer: Self-pay | Admitting: Family Medicine

## 2023-04-28 ENCOUNTER — Other Ambulatory Visit: Payer: Self-pay

## 2023-04-28 ENCOUNTER — Ambulatory Visit (HOSPITAL_COMMUNITY)
Admission: RE | Admit: 2023-04-28 | Discharge: 2023-04-28 | Disposition: A | Discharge status: Home / Self Care | Payer: Medicaid Other | Source: Ambulatory Visit | Attending: Family Medicine | Admitting: Family Medicine

## 2023-04-28 VITALS — BP 144/80 | HR 80 | Temp 98.1°F | Ht 64.5 in | Wt 307.0 lb

## 2023-04-28 DIAGNOSIS — R0602 Shortness of breath: Secondary | ICD-10-CM

## 2023-04-28 DIAGNOSIS — M1711 Unilateral primary osteoarthritis, right knee: Secondary | ICD-10-CM | POA: Diagnosis not present

## 2023-04-28 DIAGNOSIS — R5383 Other fatigue: Secondary | ICD-10-CM | POA: Diagnosis present

## 2023-04-28 DIAGNOSIS — Z1331 Encounter for screening for depression: Secondary | ICD-10-CM | POA: Insufficient documentation

## 2023-04-28 DIAGNOSIS — Z0289 Encounter for other administrative examinations: Secondary | ICD-10-CM

## 2023-04-28 DIAGNOSIS — R632 Polyphagia: Secondary | ICD-10-CM | POA: Insufficient documentation

## 2023-04-28 DIAGNOSIS — Z6841 Body Mass Index (BMI) 40.0 and over, adult: Secondary | ICD-10-CM

## 2023-04-28 DIAGNOSIS — I10 Essential (primary) hypertension: Secondary | ICD-10-CM | POA: Diagnosis not present

## 2023-04-28 MED ORDER — WEGOVY 0.25 MG/0.5ML ~~LOC~~ SOAJ: 0.2500 mg | SUBCUTANEOUS | 0 refills | Status: DC

## 2023-04-29 ENCOUNTER — Telehealth: Payer: Self-pay | Admitting: *Deleted

## 2023-04-29 LAB — CBC WITH DIFFERENTIAL/PLATELET
Basophils Absolute: 0 10*3/uL (ref 0.0–0.2)
Basos: 1 %
EOS (ABSOLUTE): 0.1 10*3/uL (ref 0.0–0.4)
Eos: 3 %
Hematocrit: 40.9 % (ref 34.0–46.6)
Hemoglobin: 13.2 g/dL (ref 11.1–15.9)
Immature Grans (Abs): 0 10*3/uL (ref 0.0–0.1)
Immature Granulocytes: 0 %
Lymphocytes Absolute: 1.3 10*3/uL (ref 0.7–3.1)
Lymphs: 22 %
MCH: 28.4 pg (ref 26.6–33.0)
MCHC: 32.3 g/dL (ref 31.5–35.7)
MCV: 88 fL (ref 79–97)
Monocytes Absolute: 0.5 10*3/uL (ref 0.1–0.9)
Monocytes: 8 %
Neutrophils Absolute: 3.7 10*3/uL (ref 1.4–7.0)
Neutrophils: 66 %
Platelets: 275 10*3/uL (ref 150–450)
RBC: 4.65 x10E6/uL (ref 3.77–5.28)
RDW: 12.5 % (ref 11.7–15.4)
WBC: 5.6 10*3/uL (ref 3.4–10.8)

## 2023-04-29 LAB — COMPREHENSIVE METABOLIC PANEL
ALT: 17 [IU]/L (ref 0–32)
AST: 19 [IU]/L (ref 0–40)
Albumin: 4.4 g/dL (ref 3.8–4.9)
Alkaline Phosphatase: 79 [IU]/L (ref 44–121)
BUN/Creatinine Ratio: 15 (ref 12–28)
BUN: 15 mg/dL (ref 8–27)
Bilirubin Total: 0.5 mg/dL (ref 0.0–1.2)
CO2: 26 mmol/L (ref 20–29)
Calcium: 9.8 mg/dL (ref 8.7–10.3)
Chloride: 100 mmol/L (ref 96–106)
Creatinine, Ser: 1 mg/dL (ref 0.57–1.00)
Globulin, Total: 3 g/dL (ref 1.5–4.5)
Glucose: 84 mg/dL (ref 70–99)
Potassium: 5.5 mmol/L — ABNORMAL HIGH (ref 3.5–5.2)
Sodium: 139 mmol/L (ref 134–144)
Total Protein: 7.4 g/dL (ref 6.0–8.5)
eGFR: 64 mL/min/{1.73_m2} (ref >59)

## 2023-04-29 LAB — LIPID PANEL
Chol/HDL Ratio: 3.9 {ratio} (ref 0.0–4.4)
Cholesterol, Total: 214 mg/dL — ABNORMAL HIGH (ref 100–199)
HDL: 55 mg/dL (ref >39)
LDL Chol Calc (NIH): 132 mg/dL — ABNORMAL HIGH (ref 0–99)
Triglycerides: 150 mg/dL — ABNORMAL HIGH (ref 0–149)
VLDL Cholesterol Cal: 27 mg/dL (ref 5–40)

## 2023-04-29 LAB — T4, FREE: Free T4: 0.97 ng/dL (ref 0.82–1.77)

## 2023-04-29 LAB — VITAMIN B12: Vitamin B-12: 470 pg/mL (ref 232–1245)

## 2023-04-29 LAB — FOLATE: Folate: 8.4 ng/mL (ref >3.0)

## 2023-04-29 LAB — INSULIN, RANDOM: INSULIN: 20.6 u[IU]/mL (ref 2.6–24.9)

## 2023-04-29 LAB — HEMOGLOBIN A1C
Est. average glucose Bld gHb Est-mCnc: 97 mg/dL
Hgb A1c MFr Bld: 5 % (ref 4.8–5.6)

## 2023-04-29 LAB — T3: T3, Total: 131 ng/dL (ref 71–180)

## 2023-04-29 LAB — TSH: TSH: 3.49 u[IU]/mL (ref 0.450–4.500)

## 2023-04-29 LAB — VITAMIN D 25 HYDROXY (VIT D DEFICIENCY, FRACTURES): Vit D, 25-Hydroxy: 35.5 ng/mL (ref 30.0–100.0)

## 2023-04-29 NOTE — Progress Notes (Signed)
Chief Complaint:   OBESITY Jenna Mccann (MR# [id number]) is a 62 y.o. female who presents for evaluation and treatment of obesity and related comorbidities. Current BMI is Body mass index is 51.88 kg/m. Jenna Mccann has been struggling with her weight for many years and has been unsuccessful in either losing weight, maintaining weight loss, or reaching her healthy weight goal.  Jenna Mccann is currently in the action stage of change and ready to dedicate time achieving and maintaining a healthier weight. Jenna Mccann is interested in becoming our patient and working on intensive lifestyle modifications including (but not limited to) diet and exercise for weight loss.  Patient is married and her 67 year old granddaughter lives with her.  She is walking daily.  She drinks 32 ounces of Sprite daily and she tends to eat ice cream.  She had a left total knee replacement 2 years ago, and she needs a right total knee replacement with BMI reduction.  Jenna Mccann's habits were reviewed today and are as follows: Her family eats meals together, she thinks her family will eat healthier with her, her desired weight loss is 62 lbs, she has been heavy most of her life, she started gaining excessive weight about 3 years ago, her heaviest weight ever was 310 pounds, she has significant food cravings issues, she snacks frequently in the evenings, she skips meals frequently, she is frequently drinking liquids with calories, and she frequently makes poor food choices.  Depression Screen Jenna Mccann's Food and Mood (modified PHQ-9) score was 4.  Subjective:   1. Other fatigue Jenna Mccann admits to daytime somnolence and denies waking up still tired. Patient has a history of symptoms of daytime fatigue. Jenna Mccann generally gets 7 hours of sleep per night, and states that she has generally restful sleep. Snoring is not present. Apneic episodes are not present. Epworth Sleepiness Score is 4.  EKG normal sinus rhythm of 74 bpm, TWI in III,  1-3.  2. SOBOE (shortness of breath on exertion) Jenna Mccann notes increasing shortness of breath with exercising and seems to be worsening over time with weight gain. She notes getting out of breath sooner with activity than she used to. This has not gotten worse recently. Jenna Mccann denies shortness of breath at rest or orthopnea.  3. Osteoarthritis of right knee, unspecified osteoarthritis type Patient needs a right total knee replacement once her BMI is below 40.  She takes ibuprofen 800 mg twice daily for pain.  She is able to walk shorter distances.  4. Essential hypertension Patient's blood pressure is elevated on losartan 50 mg once daily.  She denies chest pain or dyspnea on exertion.  She has no family history of hypertension.  5. Polyphagia Patient complains of increased hunger at night, likely worsened by under eating during the day, lacking adequate protein and fiber with high intake of sugar sweetened beverages.  Assessment/Plan:   1. Other fatigue Jenna Mccann does feel that her weight is causing her energy to be lower than it should be. Fatigue may be related to obesity, depression or many other causes. Labs will be ordered, and in the meanwhile, Jenna Mccann will focus on self care including making healthy food choices, increasing physical activity and focusing on stress reduction.  - EKG 12-Lead - VITAMIN D 25 Hydroxy (Vit-D Deficiency, Fractures) - TSH - T4, free - T3 - Lipid panel - Insulin, random - Hemoglobin A1c - Folate - Comprehensive metabolic panel - Vitamin B12 - CBC with Differential/Platelet  2. SOBOE (shortness of breath on exertion) Jenna Mccann  does feel that she gets out of breath more easily that she used to when she exercises. Jenna Mccann's shortness of breath appears to be obesity related and exercise induced. She has agreed to work on weight loss and gradually increase exercise to treat her exercise induced shortness of breath. Will continue to monitor closely.  3.  Osteoarthritis of right knee, unspecified osteoarthritis type Patient will begin her active plan for weight reduction.  4. Essential hypertension Patient is to look for improvements in hypertension with weight loss.  5. Polyphagia Patient will begin her prescribed diet.  Patient will begin Wegovy 0.25 mg once weekly with no refills. Patient denies a personal or family history of pancreatitis, medullary thyroid carcinoma or multiple endocrine neoplasia type II. Recommend reviewing pen training video online.  - Semaglutide-Weight Management (WEGOVY) 0.25 MG/0.5ML SOAJ; Inject 0.25 mg into the skin once a week.  Dispense: 2 mL; Refill: 0  6. Depression screen Jenna Mccann had a positive depression screening. Depression is commonly associated with obesity and often results in emotional eating behaviors. We will monitor this closely and work on CBT to help improve the non-hunger eating patterns. Referral to Psychology may be required if no improvement is seen as she continues in our clinic.  7. BMI 50.0-59.9, adult ([facility])  8. Morbid obesity with starting BMI 51.9 Patient will begin [facility].   - Semaglutide-Weight Management (WEGOVY) 0.25 MG/0.5ML SOAJ; Inject 0.25 mg into the skin once a week.  Dispense: 2 mL; Refill: 0  Jenna Mccann is currently in the action stage of change and her goal is to continue with weight loss efforts. I recommend Jenna Mccann begin the structured treatment plan as follows:  She has agreed to the Category 3 Plan.  100-calorie snack list was given.  Patient is to stop sugar sweetened beverages.    Exercise goals: All adults should avoid inactivity. Some physical activity is better than none, and adults who participate in any amount of physical activity gain some health benefits. Begin chair exercise on YouTube 3 times per week.  Behavioral modification strategies: increasing lean protein intake, increasing vegetables, increasing water intake, decreasing liquid calories, decreasing  eating out, no skipping meals, keeping healthy foods in the home, better snacking choices, and decreasing junk food.  She was informed of the importance of frequent follow-up visits to maximize her success with intensive lifestyle modifications for her multiple health conditions. She was informed we would discuss her lab results at her next visit unless there is a critical issue that needs to be addressed sooner. Jenna Mccann agreed to keep her next visit at the agreed upon time to discuss these results.  Objective:   Blood pressure (!) 144/80, pulse 80, temperature 98.1 F (36.7 C), height 5' 4.5" (1.638 m), weight (!) 307 lb (139.3 kg), SpO2 95%. Body mass index is 51.88 kg/m.  EKG: Normal sinus rhythm, rate 74 BPM.  Indirect Calorimeter completed today shows a VO2 of 296 and a REE of 2045.  Her calculated basal metabolic rate is 0865 thus her basal metabolic rate is better than expected.  General: Cooperative, alert, well developed, in no acute distress. HEENT: Conjunctivae and lids unremarkable. Cardiovascular: Regular rhythm.  Lungs: Normal work of breathing. Neurologic: No focal deficits.   Lab Results  Component Value Date   CREATININE 1.00 04/28/2023   BUN 15 04/28/2023   NA 139 04/28/2023   K 5.5 (H) 04/28/2023   CL 100 04/28/2023   CO2 26 04/28/2023   Lab Results  Component Value Date   ALT  17 04/28/2023   AST 19 04/28/2023   ALKPHOS 79 04/28/2023   BILITOT 0.5 04/28/2023   Lab Results  Component Value Date   HGBA1C 5.0 04/28/2023   HGBA1C 5.1 03/03/2015   Lab Results  Component Value Date   INSULIN 20.6 04/28/2023   Lab Results  Component Value Date   TSH 3.490 04/28/2023   Lab Results  Component Value Date   CHOL 214 (H) 04/28/2023   HDL 55 04/28/2023   LDLCALC 132 (H) 04/28/2023   TRIG 150 (H) 04/28/2023   CHOLHDL 3.9 04/28/2023   Lab Results  Component Value Date   WBC 5.6 04/28/2023   HGB 13.2 04/28/2023   HCT 40.9 04/28/2023   MCV 88 04/28/2023    PLT 275 04/28/2023   No results found for: "IRON", "TIBC", "FERRITIN"  Attestation Statements:   Reviewed by clinician on day of visit: allergies, medications, problem list, medical history, surgical history, family history, social history, and previous encounter notes.  Time spent on visit including pre-visit chart review and post-visit charting and care was 40 minutes.   Trude Mcburney, am acting as transcriptionist for Seymour Bars, DO.  I have reviewed the above documentation for accuracy and completeness, and I agree with the above. Seymour Bars, DO

## 2023-04-29 NOTE — Telephone Encounter (Signed)
Prior authorization done via cover my meds for patients. Wegovy. Waiting on determination.

## 2023-04-29 NOTE — Telephone Encounter (Signed)
Prior authorization approved for patients Wegovy.   Approval Timeframe: Start Date 04/29/2023 End Date 10/26/2023

## 2023-05-12 ENCOUNTER — Ambulatory Visit: Payer: Medicaid Other | Admitting: Family Medicine

## 2023-05-26 ENCOUNTER — Ambulatory Visit: Payer: Medicaid Other | Admitting: Family Medicine

## 2023-05-26 ENCOUNTER — Encounter: Payer: Self-pay | Admitting: Family Medicine

## 2023-05-26 VITALS — BP 171/109 | HR 85 | Temp 98.0°F | Ht 64.5 in | Wt 309.0 lb

## 2023-05-26 DIAGNOSIS — M1711 Unilateral primary osteoarthritis, right knee: Secondary | ICD-10-CM | POA: Diagnosis not present

## 2023-05-26 DIAGNOSIS — E66813 Obesity, class 3: Secondary | ICD-10-CM | POA: Diagnosis not present

## 2023-05-26 DIAGNOSIS — E875 Hyperkalemia: Secondary | ICD-10-CM | POA: Insufficient documentation

## 2023-05-26 DIAGNOSIS — I10 Essential (primary) hypertension: Secondary | ICD-10-CM

## 2023-05-26 DIAGNOSIS — E88819 Insulin resistance, unspecified: Secondary | ICD-10-CM | POA: Insufficient documentation

## 2023-05-26 DIAGNOSIS — Z6841 Body Mass Index (BMI) 40.0 and over, adult: Secondary | ICD-10-CM

## 2023-05-26 DIAGNOSIS — E559 Vitamin D deficiency, unspecified: Secondary | ICD-10-CM | POA: Insufficient documentation

## 2023-05-26 NOTE — Assessment & Plan Note (Signed)
Awaiting right knee arthroplasty, needing BMI reduction.  Right knee pain has hindered her ability to exercise.  She is able to do some light walking but is limited with the knee giving way.  She is currently on ibuprofen 800 mg twice daily with food as needed for pain without adverse side effect.  Follow-up with orthopedics.  Continue active plan for preop BMI reduction.

## 2023-05-26 NOTE — Assessment & Plan Note (Signed)
Potassium was mildly elevated on last set of labs.  Reviewed this result today.  Potassium was 5.5.  She denies use of a salt substitute or any potassium replacement products.  She is asymptomatic, denies heart palpitations.  She is on an ARB for hypertension.  Repeat potassium with next labs in 3 months.

## 2023-05-26 NOTE — Progress Notes (Signed)
Office: [phone]  /  Fax: [phone]  WEIGHT SUMMARY AND BIOMETRICS  Starting Date: 04/28/23  Starting Weight: 307lb   Weight Lost Since Last Visit: 0lb   Vitals Temp: 98 F (36.7 C) BP: (!) 171/109 Pulse Rate: 85 SpO2: 98 %   Body Composition  Body Fat %: 57.8 % Fat Mass (lbs): 179 lbs Muscle Mass (lbs): 124.2 lbs Visceral Fat Rating : 23   HPI  Chief Complaint: OBESITY  Jenna Mccann is here to discuss her progress with her obesity treatment plan. She is on the the Category 3 Plan and states she is following her eating plan approximately 70 % of the time. She states she is exercising 20-30 minutes 3 times per week.   Interval History:  Since last office visit she is up 2 lb She just picked up her Wegovy to start today She has been cutting out soda and sweets She has been through several deaths and just found out that her sister died She is getting in fruits and veggies She has limited eating out She is making good choices with snacks She is doing some walking with her husband with some R knee pain Ibuprofen 800 mg bid everyday for knee pain  Pharmacotherapy: none  PHYSICAL EXAM:  Blood pressure (!) 171/109, pulse 85, temperature 98 F (36.7 C), height 5' 4.5" (1.638 m), weight (!) 309 lb (140.2 kg), SpO2 98%. Body mass index is 52.22 kg/m.  General: She is overweight, cooperative, alert, well developed, and in no acute distress. PSYCH: Has normal mood, affect and thought process.   Lungs: Normal breathing effort, no conversational dyspnea.   ASSESSMENT AND PLAN  TREATMENT PLAN FOR OBESITY:  Recommended Dietary Goals  Alaysa is currently in the action stage of change. As such, her goal is to continue weight management plan. She has agreed to the Category 3 Plan.  Behavioral Intervention  We discussed the following Behavioral Modification Strategies today: increasing lean protein intake to established goals, decreasing simple carbohydrates ,  increasing vegetables, increasing water intake , work on meal planning and preparation, keeping healthy foods at home, avoiding temptations and identifying enticing environmental cues, planning for success, better snacking choices, and continue to work on maintaining a reduced calorie state, getting the recommended amount of protein, incorporating whole foods, making healthy choices, staying well hydrated and practicing mindfulness when eating..  Additional resources provided today: NA  Recommended Physical Activity Goals  [name] has been advised to work up to 150 minutes of moderate intensity aerobic activity a week and strengthening exercises 2-3 times per week for cardiovascular health, weight loss maintenance and preservation of muscle mass.   She has agreed to Think about enjoyable ways to increase daily physical activity and overcoming barriers to exercise and Increase physical activity in their day and reduce sedentary time (increase NEAT).  Pharmacotherapy changes for the treatment of obesity: Starting Wegovy 0.25 mg once weekly injection  ASSOCIATED CONDITIONS ADDRESSED TODAY  Essential hypertension Assessment & Plan: Blood pressure is elevated today.  She reports fair compliance taking losartan 50 mg once daily.  She is emotionally upset as she just learned of her sister's passing today.  She denies headache or chest pain.  Encouraged improved compliance taking losartan 50 mg once daily.  Monitor home blood pressure readings.  Work on stress reduction, deep breathing exercises and adequate sleep at night.  Look for improvements in hypertension with weight reduction.   Class 3 obesity with alveolar hypoventilation, serious comorbidity, and body mass index (BMI) of  50.0 to 59.9 in adult [facility])  Osteoarthritis of right knee, unspecified osteoarthritis type Assessment & Plan: Awaiting right knee arthroplasty, needing BMI reduction.  Right knee pain has hindered her ability to exercise.   She is able to do some light walking but is limited with the knee giving way.  She is currently on ibuprofen 800 mg twice daily with food as needed for pain without adverse side effect.  Follow-up with orthopedics.  Continue active plan for preop BMI reduction.   Hyperkalemia Assessment & Plan: Potassium was mildly elevated on last set of labs.  Reviewed this result today.  Potassium was 5.5.  She denies use of a salt substitute or any potassium replacement products.  She is asymptomatic, denies heart palpitations.  She is on an ARB for hypertension.  Repeat potassium with next labs in 3 months.   Vitamin D deficiency Assessment & Plan: Last vitamin D Lab Results  Component Value Date   VD25OH 35.5 04/28/2023  Reviewed labs with patient.  Her vitamin D level is under the target goal of 35.5.  She has been started on over-the-counter vitamin D 5000 IU once daily by her PCP.  She reports fair compliance in taking this but does have some fatigue.  Encouraged daily use of vitamin D 5000 IU once daily and plan to recheck level in 3 months   Insulin resistance Assessment & Plan: Reviewed lab results with patient.  Her fasting insulin is elevated with a normal A1c.  She has started working on reducing her intake of added sugar and refined carbohydrates with some room for improvement.  Her exercise remains fairly limited due to right DJD pain in her knee.  She has never used metformin and will be starting [facility] for obesity management.  Continue on prescribed dietary plan.  Exercise as tolerated.  Look for improvements in fasting insulin levels while on Wegovy.       She was informed of the importance of frequent follow up visits to maximize her success with intensive lifestyle modifications for her multiple health conditions.   ATTESTASTION STATEMENTS:  Reviewed by clinician on day of visit: allergies, medications, problem list, medical history, surgical history, family history,  social history, and previous encounter notes pertinent to obesity diagnosis.   I have personally spent 30 minutes total time today in preparation, patient care, nutritional counseling and documentation for this visit, including the following: review of clinical lab tests; review of medical tests/procedures/services.      Glennis Brink, DO DABFM, DABOM [facility] [address] [place], [place] [address] [phone]

## 2023-05-26 NOTE — Assessment & Plan Note (Signed)
Last vitamin D Lab Results  Component Value Date   VD25OH 35.5 04/28/2023  Reviewed labs with patient.  Her vitamin D level is under the target goal of 35.5.  She has been started on over-the-counter vitamin D 5000 IU once daily by her PCP.  She reports fair compliance in taking this but does have some fatigue.  Encouraged daily use of vitamin D 5000 IU once daily and plan to recheck level in 3 months

## 2023-05-26 NOTE — Assessment & Plan Note (Signed)
Reviewed lab results with patient.  Her fasting insulin is elevated with a normal A1c.  She has started working on reducing her intake of added sugar and refined carbohydrates with some room for improvement.  Her exercise remains fairly limited due to right DJD pain in her knee.  She has never used metformin and will be starting [facility] for obesity management.  Continue on prescribed dietary plan.  Exercise as tolerated.  Look for improvements in fasting insulin levels while on Wegovy.

## 2023-05-26 NOTE — Assessment & Plan Note (Signed)
Blood pressure is elevated today.  She reports fair compliance taking losartan 50 mg once daily.  She is emotionally upset as she just learned of her sister's passing today.  She denies headache or chest pain.  Encouraged improved compliance taking losartan 50 mg once daily.  Monitor home blood pressure readings.  Work on stress reduction, deep breathing exercises and adequate sleep at night.  Look for improvements in hypertension with weight reduction.

## 2023-06-02 ENCOUNTER — Ambulatory Visit: Payer: Medicaid Other | Admitting: Orthopedic Surgery

## 2023-06-22 ENCOUNTER — Encounter: Payer: Self-pay | Admitting: Family Medicine

## 2023-06-22 ENCOUNTER — Ambulatory Visit (INDEPENDENT_AMBULATORY_CARE_PROVIDER_SITE_OTHER): Payer: Medicaid Other | Admitting: Family Medicine

## 2023-06-22 VITALS — BP 168/94 | HR 74 | Temp 98.6°F | Ht 64.5 in | Wt 308.0 lb

## 2023-06-22 DIAGNOSIS — I10 Essential (primary) hypertension: Secondary | ICD-10-CM | POA: Diagnosis not present

## 2023-06-22 DIAGNOSIS — M1711 Unilateral primary osteoarthritis, right knee: Secondary | ICD-10-CM | POA: Diagnosis not present

## 2023-06-22 DIAGNOSIS — R632 Polyphagia: Secondary | ICD-10-CM

## 2023-06-22 DIAGNOSIS — E66813 Obesity, class 3: Secondary | ICD-10-CM

## 2023-06-22 DIAGNOSIS — Z6841 Body Mass Index (BMI) 40.0 and over, adult: Secondary | ICD-10-CM

## 2023-06-22 MED ORDER — WEGOVY 0.5 MG/0.5ML ~~LOC~~ SOAJ: 0.5000 mg | SUBCUTANEOUS | 0 refills | Status: DC

## 2023-06-22 MED ORDER — LOSARTAN POTASSIUM 50 MG PO TABS: 50.0000 mg | ORAL_TABLET | Freq: Every day | ORAL | 0 refills | Status: DC

## 2023-06-22 NOTE — Telephone Encounter (Signed)
Message from plan: Approved. This drug has been approved. Approved quantity: 2 ML per 28 day(s). You may fill up to a 34 day supply at a retail pharmacy. You may fill up to a 90 day supply for maintenance drugs, please refer to the formulary for details. Please call the pharmacy to process your prescription claim.. Authorization Expiration Date: October 26, 2023.

## 2023-06-22 NOTE — Progress Notes (Signed)
Office: [phone]  /  Fax: [phone]  WEIGHT SUMMARY AND BIOMETRICS  Starting Date: 04/28/23  Starting Weight: 307lb   Weight Lost Since Last Visit: 1lb   Vitals Temp: 98.6 F (37 C) BP: (!) 168/94 Pulse Rate: 74 SpO2: 98 %   Body Composition  Body Fat %: 56.8 % Fat Mass (lbs): 175.2 lbs Muscle Mass (lbs): 126.4 lbs Visceral Fat Rating : 23    HPI  Chief Complaint: OBESITY  Jenna Mccann is here to discuss her progress with her obesity treatment plan. She is on the the Category 3 Plan and states she is following her eating plan approximately 70 % of the time. She states she is exercising 30 minutes 3 times per week.   Interval History:  Since last office visit she is down 1 lb She has a net weight gain of 1 lb in the past 1.5 mos She has some sugar cravings but is keeping junk food out of the house She has ice cream ~1 x a week She has a good a good support system She did start on Wegovy 0.25 mg 4 weeks ago with little improved satiety She is still walking 30 min 3 x a week, limited by arthritis pain in R knee  Pharmacotherapy: Wegovy 0.25 mg weekly  PHYSICAL EXAM:  Blood pressure (!) 168/94, pulse 74, temperature 98.6 F (37 C), height 5' 4.5" (1.638 m), weight (!) 308 lb (139.7 kg), SpO2 98%. Body mass index is 52.05 kg/m.  General: She is overweight, cooperative, alert, well developed, and in no acute distress. PSYCH: Has normal mood, affect and thought process.   Lungs: Normal breathing effort, no conversational dyspnea.   ASSESSMENT AND PLAN  TREATMENT PLAN FOR OBESITY:  Recommended Dietary Goals  Jenna Mccann is currently in the action stage of change. As such, her goal is to continue weight management plan. She has agreed to the Category 3 Plan.  Behavioral Intervention  We discussed the following Behavioral Modification Strategies today: increasing lean protein intake to established goals, increasing vegetables, increasing lower glycemic fruits,  avoiding skipping meals, increasing water intake , work on meal planning and preparation, work on managing stress, creating time for self-care and relaxation, avoiding temptations and identifying enticing environmental cues, continue to work on implementation of reduced calorie nutritional plan, better snacking choices, and continue to work on maintaining a reduced calorie state, getting the recommended amount of protein, incorporating whole foods, making healthy choices, staying well hydrated and practicing mindfulness when eating..  Additional resources provided today: NA  Recommended Physical Activity Goals  Jenna Mccann has been advised to work up to 150 minutes of moderate intensity aerobic activity a week and strengthening exercises 2-3 times per week for cardiovascular health, weight loss maintenance and preservation of muscle mass.   She has agreed to Think about enjoyable ways to increase daily physical activity and overcoming barriers to exercise and Increase physical activity in their day and reduce sedentary time (increase NEAT).  Pharmacotherapy changes for the treatment of obesity: increase Wegovy to 0.5 mg weekly  ASSOCIATED CONDITIONS ADDRESSED TODAY  Polyphagia Assessment & Plan: Unchanged over the past 4 weeks on Wegovy 0.25 mg once weekly injection She has been working on getting in lean protein and fiber with all of her meals on her prescribed meal plan  Continue category 3 meal plan, increasing Wegovy to 0.5 mg once weekly injection Aim for approximately 100 g of protein intake daily   Class 3 severe obesity due to excess calories with serious comorbidity  and body mass index (BMI) of 50.0 to 59.9 in adult [facility]) -     Wegovy; Inject 0.5 mg into the skin once a week.  Dispense: 2 mL; Refill: 0  Osteoarthritis of right knee, unspecified osteoarthritis type Assessment & Plan: Right knee DJD pain continues to hinder her walking though she is able to do shorter distances.  She is  actively working on BMI reduction in order to have right knee replacement surgery done hopefully next year.  BMI target is less than 40.  We discussed some indoor chair exercise options   Essential hypertension Assessment & Plan:  blood pressure is elevated today She ran out of her losartan 50 mg tab about 1 week ago.  She denies headache or chest pain. She is actively working on weight loss.  Refilled her losartan 50 mg once daily.  This may be resumed by her PCP at her next visit.  Orders: -     Losartan Potassium; Take 1 tablet (50 mg total) by mouth daily.  Dispense: 90 tablet; Refill: 0      She was informed of the importance of frequent follow up visits to maximize her success with intensive lifestyle modifications for her multiple health conditions.   ATTESTASTION STATEMENTS:  Reviewed by clinician on day of visit: allergies, medications, problem list, medical history, surgical history, family history, social history, and previous encounter notes pertinent to obesity diagnosis.   I have personally spent 30 minutes total time today in preparation, patient care, nutritional counseling and documentation for this visit, including the following: review of clinical lab tests; review of medical tests/procedures/services.      Jenna Brink, DO DABFM, DABOM [facility] [address] [place], [place] [address] [phone]

## 2023-06-22 NOTE — Assessment & Plan Note (Signed)
Right knee DJD pain continues to hinder her walking though she is able to do shorter distances.  She is actively working on BMI reduction in order to have right knee replacement surgery done hopefully next year.  BMI target is less than 40.  We discussed some indoor chair exercise options

## 2023-06-22 NOTE — Assessment & Plan Note (Signed)
Unchanged over the past 4 weeks on Wegovy 0.25 mg once weekly injection She has been working on getting in lean protein and fiber with all of her meals on her prescribed meal plan  Continue category 3 meal plan, increasing Wegovy to 0.5 mg once weekly injection Aim for approximately 100 g of protein intake daily

## 2023-06-22 NOTE — Assessment & Plan Note (Signed)
blood pressure is elevated today She ran out of her losartan 50 mg tab about 1 week ago.  She denies headache or chest pain. She is actively working on weight loss.  Refilled her losartan 50 mg once daily.  This may be resumed by her PCP at her next visit.

## 2023-06-24 ENCOUNTER — Telehealth (INDEPENDENT_AMBULATORY_CARE_PROVIDER_SITE_OTHER): Payer: Self-pay | Admitting: Family Medicine

## 2023-06-24 NOTE — Telephone Encounter (Signed)
Phone call to pharmacy regarding patient prescription for her [facility]. Pharmacy is stating she need prior authorization but we have completed one and it was approved per cover my meds. Will have to call patient insurance to figure out what's going on.

## 2023-06-24 NOTE — Telephone Encounter (Signed)
Patient called in stating she wants to let Dr. Cathey Endow know she is having a hard time getting her Wegovy. She called the pharmacy Monday and the medicine was out of stock. The patient called the pharmacy back today and the pharmacy stated she needs a note sent to the insurance company explaining why the dose was increased.  Thank you

## 2023-06-29 NOTE — Telephone Encounter (Signed)
Re submitted patients Prior authorization for her [facility]. Waiting to hear back from insurance.

## 2023-06-29 NOTE — Telephone Encounter (Signed)
Prior authorization approved for patients Wegovy 0.5 mg. Patient has been notified. Her pharmacy is out of stock. She will call around to find in stock stores.

## 2023-07-23 ENCOUNTER — Encounter: Payer: Self-pay | Admitting: Family Medicine

## 2023-07-23 ENCOUNTER — Ambulatory Visit: Payer: Medicaid Other | Admitting: Family Medicine

## 2023-07-23 VITALS — BP 153/91 | HR 73 | Temp 97.7°F | Ht 64.5 in | Wt 311.0 lb

## 2023-07-23 DIAGNOSIS — M1711 Unilateral primary osteoarthritis, right knee: Secondary | ICD-10-CM | POA: Diagnosis not present

## 2023-07-23 DIAGNOSIS — Z6841 Body Mass Index (BMI) 40.0 and over, adult: Secondary | ICD-10-CM

## 2023-07-23 DIAGNOSIS — E875 Hyperkalemia: Secondary | ICD-10-CM | POA: Diagnosis not present

## 2023-07-23 DIAGNOSIS — E66813 Obesity, class 3: Secondary | ICD-10-CM | POA: Diagnosis not present

## 2023-07-23 DIAGNOSIS — I10 Essential (primary) hypertension: Secondary | ICD-10-CM

## 2023-07-23 MED ORDER — WEGOVY 1 MG/0.5ML ~~LOC~~ SOAJ: 1.0000 mg | SUBCUTANEOUS | 0 refills | Status: DC

## 2023-07-23 NOTE — Assessment & Plan Note (Signed)
Patient had mild elevation in her potassium on her most recent CMP.  Will plan to repeat her CMP next visit.

## 2023-07-23 NOTE — Assessment & Plan Note (Signed)
R knee DJD pain continues to be a barrier to her increasing her walking time This has been a barrier to her weight loss She needs BMI reduction for TKR  Continue active plan for weight reduction Consider PT/ water exercises in the new year

## 2023-07-23 NOTE — Assessment & Plan Note (Signed)
Review of patient's overall progress.  She has a net weight gain of 4 pounds in the past 2-1/2 months of medically supervised weight management.  Barriers to her progress have included lack of exercise due to DJD pain in her right knee, poor compliance with prescribed diet, stressors including the death of her sister.  She has not yet seen weight loss on Wegovy but has room for escalating her dose.  She denies adverse side effects.  Continue to work on prescribed dietary changes.  Consider alternatives to exercise other than walking given the limitations from her right knee pain. Consider CBT given underlying psychosocial stressors  Increase Wegovy to 1 mg once weekly injection

## 2023-07-23 NOTE — Progress Notes (Signed)
Office: [phone]  /  Fax: [phone]  WEIGHT SUMMARY AND BIOMETRICS  Starting Date: 04/28/23  Starting Weight: 307lb   Weight Lost Since Last Visit: 0lb   Vitals Temp: 97.7 F (36.5 C) BP: (!) 153/91 Pulse Rate: 73 SpO2: 95 %   Body Composition  Body Fat %: 59.7 % Fat Mass (lbs): 186 lbs Muscle Mass (lbs): 119.2 lbs Visceral Fat Rating : 24     HPI  Chief Complaint: OBESITY  Jenna Mccann is here to discuss her progress with her obesity treatment plan. She is on the the Category 3 Plan and states she is following her eating plan approximately 80 % of the time. She states she is exercising 30 minutes 7 times per week.   Interval History:  Since last office visit she is up 3 lb She has done 4 weeks of Wegovy 0.5 mg  with some improvements in food volumes She has a net weight gain of 4 lb in the past 2.5 mos She has not seen any changes in her sugar cravings She has been going thru a lot of emotional stressors with the holidays She has declined logging her calorie intake Her R knee DJD pain has limited her walking   Pharmacotherapy: Wegovy 0.5 mg weekly  PHYSICAL EXAM:  Blood pressure (!) 153/91, pulse 73, temperature 97.7 F (36.5 C), height 5' 4.5" (1.638 m), weight (!) 311 lb (141.1 kg), SpO2 95%. Body mass index is 52.56 kg/m.  General: She is overweight, cooperative, alert, well developed, and in no acute distress. PSYCH: Has normal mood, affect and thought process.   Lungs: Normal breathing effort, no conversational dyspnea.   ASSESSMENT AND PLAN  TREATMENT PLAN FOR OBESITY:  Recommended Dietary Goals  Jenna Mccann is currently in the action stage of change. As such, her goal is to continue weight management plan. She has agreed to practicing portion control and making smarter food choices, such as increasing vegetables and decreasing simple carbohydrates.  Behavioral Intervention  We discussed the following Behavioral Modification Strategies today:  increasing lean protein intake to established goals, increasing fiber rich foods, avoiding skipping meals, increasing water intake , work on meal planning and preparation, keeping healthy foods at home, identifying sources and decreasing liquid calories, continue to practice mindfulness when eating, planning for success, and continue to work on maintaining a reduced calorie state, getting the recommended amount of protein, incorporating whole foods, making healthy choices, staying well hydrated and practicing mindfulness when eating..  Additional resources provided today: NA  Recommended Physical Activity Goals  Jenna Mccann has been advised to work up to 150 minutes of moderate intensity aerobic activity a week and strengthening exercises 2-3 times per week for cardiovascular health, weight loss maintenance and preservation of muscle mass.   She has agreed to Think about enjoyable ways to increase daily physical activity and overcoming barriers to exercise and Increase physical activity in their day and reduce sedentary time (increase NEAT).  Pharmacotherapy changes for the treatment of obesity: increase Wegovy to 1 mg weekly  ASSOCIATED CONDITIONS ADDRESSED TODAY  Osteoarthritis of right knee, unspecified osteoarthritis type Assessment & Plan: R knee DJD pain continues to be a barrier to her increasing her walking time This has been a barrier to her weight loss She needs BMI reduction for TKR  Continue active plan for weight reduction Consider PT/ water exercises in the new year   Class 3 severe obesity due to excess calories with serious comorbidity and body mass index (BMI) of 50.0 to 59.9 in  adult [facility]) Assessment & Plan: Review of patient's overall progress.  She has a net weight gain of 4 pounds in the past 2-1/2 months of medically supervised weight management.  Barriers to her progress have included lack of exercise due to DJD pain in her right knee, poor compliance with prescribed diet,  stressors including the death of her sister.  She has not yet seen weight loss on Wegovy but has room for escalating her dose.  She denies adverse side effects.  Continue to work on prescribed dietary changes.  Consider alternatives to exercise other than walking given the limitations from her right knee pain. Consider CBT given underlying psychosocial stressors  Increase Wegovy to 1 mg once weekly injection  Orders: -     QIONGE; Inject 1 mg into the skin once a week.  Dispense: 2 mL; Refill: 0  Essential hypertension Assessment & Plan: Her blood pressure is elevated today.  She reports having a fall this morning in the bathroom with some bruising.  She denies any acute pain.  She is on losartan 50 mg once daily without adverse side effect.  She is not monitoring her blood pressures at home.  She denies chest pain or heart palpitations.  She did inquire about using phentermine for weight loss.  Given her age and hypertension with elevated blood pressure reading today, avoid use of phentermine.  This was discussed with patient today.  Continue current medications and active plan for weight reduction.   Hyperkalemia Assessment & Plan: Patient had mild elevation in her potassium on her most recent CMP.  Will plan to repeat her CMP next visit.       She was informed of the importance of frequent follow up visits to maximize her success with intensive lifestyle modifications for her multiple health conditions.   ATTESTASTION STATEMENTS:  Reviewed by clinician on day of visit: allergies, medications, problem list, medical history, surgical history, family history, social history, and previous encounter notes pertinent to obesity diagnosis.   I have personally spent 30 minutes total time today in preparation, patient care, nutritional counseling and documentation for this visit, including the following: review of clinical lab tests; review of medical tests/procedures/services.       Glennis Brink, DO DABFM, DABOM [facility] [address] [place], [place] [address] [phone]

## 2023-07-23 NOTE — Assessment & Plan Note (Signed)
Her blood pressure is elevated today.  She reports having a fall this morning in the bathroom with some bruising.  She denies any acute pain.  She is on losartan 50 mg once daily without adverse side effect.  She is not monitoring her blood pressures at home.  She denies chest pain or heart palpitations.  She did inquire about using phentermine for weight loss.  Given her age and hypertension with elevated blood pressure reading today, avoid use of phentermine.  This was discussed with patient today.  Continue current medications and active plan for weight reduction.

## 2023-08-26 ENCOUNTER — Ambulatory Visit (INDEPENDENT_AMBULATORY_CARE_PROVIDER_SITE_OTHER): Payer: Medicaid Other | Admitting: Family Medicine

## 2023-08-26 ENCOUNTER — Encounter: Payer: Self-pay | Admitting: Family Medicine

## 2023-08-26 VITALS — BP 170/110 | HR 73 | Temp 97.6°F | Ht 64.5 in | Wt 301.0 lb

## 2023-08-26 DIAGNOSIS — Z6841 Body Mass Index (BMI) 40.0 and over, adult: Secondary | ICD-10-CM | POA: Diagnosis not present

## 2023-08-26 DIAGNOSIS — R32 Unspecified urinary incontinence: Secondary | ICD-10-CM

## 2023-08-26 DIAGNOSIS — I10 Essential (primary) hypertension: Secondary | ICD-10-CM

## 2023-08-26 DIAGNOSIS — E785 Hyperlipidemia, unspecified: Secondary | ICD-10-CM | POA: Diagnosis not present

## 2023-08-26 DIAGNOSIS — E66813 Obesity, class 3: Secondary | ICD-10-CM | POA: Diagnosis not present

## 2023-08-26 DIAGNOSIS — E875 Hyperkalemia: Secondary | ICD-10-CM

## 2023-08-26 MED ORDER — BLOOD PRESSURE KIT: PACK | 0 refills | Status: AC

## 2023-08-26 MED ORDER — WEGOVY 1.7 MG/0.75ML ~~LOC~~ SOAJ: 1.7000 mg | SUBCUTANEOUS | 0 refills | Status: DC

## 2023-08-26 NOTE — Assessment & Plan Note (Signed)
Lab Results  Component Value Date   K 5.5 (H) 04/28/2023   She had slight elevation in her potassium in October.  She is on an ARB for hypertension.  Labs were updated at the [facility] department, not available for review.  Patient reports that her labs will be rechecked at her 2-week follow-up

## 2023-08-26 NOTE — Assessment & Plan Note (Signed)
New.  She reports new onset urinary incontinence occurring at night with urgency while being treated for a UTI with associated flank pain.  Her flank pain and urinary symptoms have improved but she is still having some urinary incontinence at night.  She is still finishing out her course of antibiotics.  Follow-up with primary care.  Look for resolution of urinary incontinence once UTI has resolved.  If not, further workup will be needed

## 2023-08-26 NOTE — Assessment & Plan Note (Signed)
Lab Results  Component Value Date   CHOL 214 (H) 04/28/2023   HDL 55 04/28/2023   LDLCALC 132 (H) 04/28/2023   TRIG 150 (H) 04/28/2023   CHOLHDL 3.9 04/28/2023  She is not on any cholesterol-lowering medication but her last set of lipids from October showed an elevated total cholesterol, and LDL elevated LDL and elevated triglycerides.  She is actively working on weight loss and reducing her intake of added sugar, refined carbohydrates and high saturated fat foods.  She has room for improvement with physical activity.  Recheck fasting lipid panel in the next 2 months

## 2023-08-26 NOTE — Assessment & Plan Note (Signed)
Blood pressure remains high today. She reports good compliance taking losartan 100 mg each evening She has a slight headache today but denies chest pain or dyspnea on exertion She does not have a home blood pressure cuff  She is scheduled to see her PCP back in 2 weeks at the [facility] department

## 2023-08-26 NOTE — Progress Notes (Signed)
Office: [phone]  /  Fax: [phone]  WEIGHT SUMMARY AND BIOMETRICS  Starting Date: 04/28/23  Starting Weight: 307lb   Weight Lost Since Last Visit: 10lb   Vitals Temp: 97.6 F (36.4 C) BP: (!) 170/110 Pulse Rate: 73 SpO2: 97 %   Body Composition  Body Fat %: 55.7 % Fat Mass (lbs): 167.8 lbs Muscle Mass (lbs): 126.8 lbs Total Body Water (lbs): 98.4 lbs Visceral Fat Rating : 22    HPI  Chief Complaint: OBESITY  Jenna Mccann is here to discuss her progress with her obesity treatment plan. She is on the the Category 3 Plan and states she is following her eating plan approximately 80 % of the time. She states she is walking for 20 minutes 3 times per week.  Interval History:  Since last office visit she is down 10 lbs This gives her a net weight loss of 6 lb in the past 3 mos She is feeling improved satiety from [facility] 1 mg once weekly injection She denies GERD, constipation or nausea She has felt hungrier 3 days prior to the next injection day She denies meal skipping and is working on improving her intake of protein she has been less physically active over the past month She is hindered by right DJD pain in her knee She has been on antibiotics for UTI, managed by the [facility] health department, labs not available for review  Pharmacotherapy: Wegovy 1 mg once weekly injection  PHYSICAL EXAM:  Blood pressure (!) 170/110, pulse 73, temperature 97.6 F (36.4 C), height 5' 4.5" (1.638 m), weight (!) 301 lb (136.5 kg), SpO2 97%. Body mass index is 50.87 kg/m.  General: She is overweight, cooperative, alert, well developed, and in no acute distress. PSYCH: Has normal mood, affect and thought process.   Lungs: Normal breathing effort, no conversational dyspnea.   ASSESSMENT AND PLAN  TREATMENT PLAN FOR OBESITY:  Recommended Dietary Goals  Massie is currently in the action stage of change. As such, her goal is to continue weight management plan. She has  agreed to the Category 3 Plan.  Behavioral Intervention  We discussed the following Behavioral Modification Strategies today: increasing lean protein intake to established goals, increasing fiber rich foods, increasing water intake , work on meal planning and preparation, keeping healthy foods at home, identifying sources and decreasing liquid calories, continue to work on implementation of reduced calorie nutritional plan, planning for success, and continue to work on maintaining a reduced calorie state, getting the recommended amount of protein, incorporating whole foods, making healthy choices, staying well hydrated and practicing mindfulness when eating..  Additional resources provided today: NA  Recommended Physical Activity Goals  Tamanika has been advised to work up to 150 minutes of moderate intensity aerobic activity a week and strengthening exercises 2-3 times per week for cardiovascular health, weight loss maintenance and preservation of muscle mass.   She has agreed to Think about enjoyable ways to increase daily physical activity and overcoming barriers to exercise and Increase physical activity in their day and reduce sedentary time (increase NEAT).  Pharmacotherapy changes for the treatment of obesity: Increase Wegovy to 1.7 mg once weekly injection  ASSOCIATED CONDITIONS ADDRESSED TODAY  Essential hypertension Assessment & Plan: Blood pressure remains high today. She reports good compliance taking losartan 100 mg each evening She has a slight headache today but denies chest pain or dyspnea on exertion She does not have a home blood pressure cuff  She is scheduled to see her PCP back in 2  weeks at the [facility] health department  Orders: -     Blood Pressure; Use daily as directed  Dispense: 1 kit; Refill: 0  Class 3 severe obesity due to excess calories with serious comorbidity and body mass index (BMI) of 50.0 to 59.9 in adult [facility]) -     [id number]; Inject 1.7 mg into the  skin once a week.  Dispense: 3 mL; Refill: 0  Hyperkalemia Assessment & Plan: Lab Results  Component Value Date   K 5.5 (H) 04/28/2023   She had slight elevation in her potassium in October.  She is on an ARB for hypertension.  Labs were updated at the [facility] department, not available for review.  Patient reports that her labs will be rechecked at her 2-week follow-up   Urinary incontinence in female Assessment & Plan: New.  She reports new onset urinary incontinence occurring at night with urgency while being treated for a UTI with associated flank pain.  Her flank pain and urinary symptoms have improved but she is still having some urinary incontinence at night.  She is still finishing out her course of antibiotics.  Follow-up with primary care.  Look for resolution of urinary incontinence once UTI has resolved.  If not, further workup will be needed   Dyslipidemia Assessment & Plan: Lab Results  Component Value Date   CHOL 214 (H) 04/28/2023   HDL 55 04/28/2023   LDLCALC 132 (H) 04/28/2023   TRIG 150 (H) 04/28/2023   CHOLHDL 3.9 04/28/2023  She is not on any cholesterol-lowering medication but her last set of lipids from October showed an elevated total cholesterol, and LDL elevated LDL and elevated triglycerides.  She is actively working on weight loss and reducing her intake of added sugar, refined carbohydrates and high saturated fat foods.  She has room for improvement with physical activity.  Recheck fasting lipid panel in the next 2 months       She was informed of the importance of frequent follow up visits to maximize her success with intensive lifestyle modifications for her multiple health conditions.   ATTESTASTION STATEMENTS:  Reviewed by clinician on day of visit: allergies, medications, problem list, medical history, surgical history, family history, social history, and previous encounter notes pertinent to obesity diagnosis.   I have personally  spent 30 minutes total time today in preparation, patient care, nutritional counseling and documentation for this visit, including the following: review of clinical lab tests; review of medical tests/procedures/services.      Glennis Brink, DO DABFM, DABOM [facility] [address] [place], [place] [address] [phone]

## 2023-08-31 ENCOUNTER — Encounter (HOSPITAL_COMMUNITY): Payer: Self-pay

## 2023-08-31 ENCOUNTER — Other Ambulatory Visit: Payer: Self-pay

## 2023-08-31 ENCOUNTER — Emergency Department (HOSPITAL_COMMUNITY)
Admission: EM | Admit: 2023-08-31 | Discharge: 2023-08-31 | Disposition: A | Discharge status: Home / Self Care | Payer: Medicaid Other | Attending: Emergency Medicine | Admitting: Emergency Medicine

## 2023-08-31 ENCOUNTER — Emergency Department (HOSPITAL_COMMUNITY): Payer: Medicaid Other

## 2023-08-31 DIAGNOSIS — R7989 Other specified abnormal findings of blood chemistry: Secondary | ICD-10-CM | POA: Diagnosis not present

## 2023-08-31 DIAGNOSIS — M545 Low back pain, unspecified: Secondary | ICD-10-CM | POA: Insufficient documentation

## 2023-08-31 LAB — CBC WITH DIFFERENTIAL/PLATELET
Abs Immature Granulocytes: 0.02 10*3/uL (ref 0.00–0.07)
Basophils Absolute: 0 10*3/uL (ref 0.0–0.1)
Basophils Relative: 1 %
Eosinophils Absolute: 0.2 10*3/uL (ref 0.0–0.5)
Eosinophils Relative: 3 %
HCT: 39.9 % (ref 36.0–46.0)
Hemoglobin: 13.1 g/dL (ref 12.0–15.0)
Immature Granulocytes: 0 %
Lymphocytes Relative: 29 %
Lymphs Abs: 2.2 10*3/uL (ref 0.7–4.0)
MCH: 28.9 pg (ref 26.0–34.0)
MCHC: 32.8 g/dL (ref 30.0–36.0)
MCV: 88.1 fL (ref 80.0–100.0)
Monocytes Absolute: 0.7 10*3/uL (ref 0.1–1.0)
Monocytes Relative: 9 %
Neutro Abs: 4.4 10*3/uL (ref 1.7–7.7)
Neutrophils Relative %: 58 %
Platelets: 362 10*3/uL (ref 150–400)
RBC: 4.53 MIL/uL (ref 3.87–5.11)
RDW: 13.4 % (ref 11.5–15.5)
WBC: 7.5 10*3/uL (ref 4.0–10.5)
nRBC: 0 % (ref 0.0–0.2)

## 2023-08-31 LAB — COMPREHENSIVE METABOLIC PANEL
ALT: 23 U/L (ref 0–44)
AST: 29 U/L (ref 15–41)
Albumin: 4.2 g/dL (ref 3.5–5.0)
Alkaline Phosphatase: 70 U/L (ref 38–126)
Anion gap: 10 (ref 5–15)
BUN: 18 mg/dL (ref 8–23)
CO2: 28 mmol/L (ref 22–32)
Calcium: 9.7 mg/dL (ref 8.9–10.3)
Chloride: 99 mmol/L (ref 98–111)
Creatinine, Ser: 1.21 mg/dL — ABNORMAL HIGH (ref 0.44–1.00)
GFR, Estimated: 51 mL/min — ABNORMAL LOW (ref >60)
Glucose, Bld: 83 mg/dL (ref 70–99)
Potassium: 4 mmol/L (ref 3.5–5.1)
Sodium: 137 mmol/L (ref 135–145)
Total Bilirubin: 0.7 mg/dL (ref 0.0–1.2)
Total Protein: 8.4 g/dL — ABNORMAL HIGH (ref 6.5–8.1)

## 2023-08-31 LAB — URINALYSIS, ROUTINE W REFLEX MICROSCOPIC
Bacteria, UA: NONE SEEN
Bilirubin Urine: NEGATIVE
Glucose, UA: NEGATIVE mg/dL
Hgb urine dipstick: NEGATIVE
Ketones, ur: NEGATIVE mg/dL
Nitrite: NEGATIVE
Protein, ur: NEGATIVE mg/dL
Specific Gravity, Urine: 1.017 (ref 1.005–1.030)
pH: 5 (ref 5.0–8.0)

## 2023-08-31 MED ORDER — HYDROCODONE-ACETAMINOPHEN 5-325 MG PO TABS: 1.0000 | ORAL_TABLET | Freq: Four times a day (QID) | ORAL | 0 refills | Status: AC | PRN

## 2023-08-31 NOTE — ED Notes (Signed)
 Patient transported to CT

## 2023-08-31 NOTE — ED Provider Notes (Signed)
[place] EMERGENCY DEPARTMENT AT [facility] Provider Note   CSN: [id number] Arrival date & time: 08/31/23  4098     History  Chief Complaint  Patient presents with   Back Pain    Jenna Mccann is a 63 y.o. female.  She is here with new right sided low back pain radiating around to her right flank and saw that it has been going on about a week and a half.  She saw her primary care doctor and was put on an antibiotic for possible infection.  She does not recall the name of the antibiotic.  No dysuria but she has not had much urination.  She does not think she has had a fever but she has had some nausea.  No gross hematuria.  Pain continues and so her doctor told her to come here to get a CAT scan to make sure she did not have a kidney stone.  She has prior history of urology issues and has had bladder surgery  The history is provided by the patient.  Back Pain Location:  Lumbar spine Quality:  Aching Radiates to: right flank and right side abdomen. Pain severity:  Severe Pain is:  Same all the time Onset quality:  Gradual Duration:  10 days Timing:  Constant Progression:  Unchanged Chronicity:  Recurrent Relieved by:  Nothing Worsened by:  Bending and movement Ineffective treatments: antibiotics. Associated symptoms: abdominal pain   Associated symptoms: no chest pain, no dysuria, no fever, no numbness and no weakness        Home Medications Prior to Admission medications   Medication Sig Start Date End Date Taking? Authorizing Provider  Blood Pressure KIT Use daily as directed 08/26/23   Bowen, Scot Jun, DO  D3 MAXIMUM STRENGTH 125 MCG (5000 UT) capsule Take 5,000 Units by mouth daily. 04/14/23   [provider]  DULoxetine (CYMBALTA) 60 MG capsule Take 60 mg by mouth daily.    [provider]  gabapentin (NEURONTIN) 300 MG capsule Take 300 mg by mouth 2 (two) times daily. 03/12/20   [provider]  ibuprofen (ADVIL) 800 MG tablet  Take 800 mg by mouth in the morning and at bedtime. 04/23/20   [provider]  losartan (COZAAR) 100 MG tablet Take 100 mg by mouth daily. 08/21/23   [provider]  losartan (COZAAR) 50 MG tablet Take 1 tablet (50 mg total) by mouth daily. 06/22/23   Bowen, Scot Jun, DO  Semaglutide-Weight Management (WEGOVY) 1.7 MG/0.75ML SOAJ Inject 1.7 mg into the skin once a week. 08/26/23   Bowen, Scot Jun, DO      Allergies    Patient has no known allergies.    Review of Systems   Review of Systems  Constitutional:  Negative for fever.  Respiratory:  Negative for shortness of breath.   Cardiovascular:  Negative for chest pain.  Gastrointestinal:  Positive for abdominal pain.  Genitourinary:  Negative for dysuria.  Musculoskeletal:  Positive for back pain.  Neurological:  Negative for weakness and numbness.    Physical Exam Updated Vital Signs BP (!) 144/84 (BP Location: Right Arm)   Pulse 74   Temp 97.7 F (36.5 C) (Oral)   Resp 18   Ht 5' 4.5" (1.638 m)   Wt (!) 136.5 kg   SpO2 (!) 10%   BMI 50.86 kg/m  Physical Exam Vitals and nursing note reviewed.  Constitutional:      General: She is not in acute distress.  Appearance: Normal appearance. She is well-developed.  HENT:     Head: Normocephalic and atraumatic.  Eyes:     Conjunctiva/sclera: Conjunctivae normal.  Cardiovascular:     Rate and Rhythm: Normal rate and regular rhythm.     Heart sounds: No murmur heard. Pulmonary:     Effort: Pulmonary effort is normal. No respiratory distress.     Breath sounds: Normal breath sounds.  Abdominal:     Palpations: Abdomen is soft.     Tenderness: There is no abdominal tenderness. There is no guarding or rebound.  Musculoskeletal:        General: Tenderness present.     Cervical back: Neck supple.     Comments: She has no midline back tenderness but does have a little bit of lower right paralumbar tenderness  Skin:    General: Skin is warm and dry.     Capillary  Refill: Capillary refill takes less than 2 seconds.  Neurological:     General: No focal deficit present.     Mental Status: She is alert.     Sensory: No sensory deficit.     Motor: No weakness.  Psychiatric:        Mood and Affect: Mood normal.     ED Results / Procedures / Treatments   Labs (all labs ordered are listed, but only abnormal results are displayed) Labs Reviewed  URINALYSIS, ROUTINE W REFLEX MICROSCOPIC - Abnormal; Notable for the following components:      Result Value   Leukocytes,Ua TRACE (*)    All other components within normal limits  COMPREHENSIVE METABOLIC PANEL - Abnormal; Notable for the following components:   Creatinine, Ser 1.21 (*)    Total Protein 8.4 (*)    GFR, Estimated 51 (*)    All other components within normal limits  CBC WITH DIFFERENTIAL/PLATELET    EKG None  Radiology CT Renal Stone Study Result Date: 08/31/2023 CLINICAL DATA:  Back pain with recent kidney infection. EXAM: CT ABDOMEN AND PELVIS WITHOUT CONTRAST TECHNIQUE: Multidetector CT imaging of the abdomen and pelvis was performed following the standard protocol without IV contrast. RADIATION DOSE REDUCTION: This exam was performed according to the departmental dose-optimization program which includes automated exposure control, adjustment of the mA and/or kV according to patient size and/or use of iterative reconstruction technique. COMPARISON:  11/20/2017 CTA abdomen and pelvis FINDINGS: Lower chest: Unremarkable. Hepatobiliary: The liver shows diffusely decreased attenuation suggesting fat deposition. No suspicious focal abnormality in the liver on this study without intravenous contrast. Gallbladder is surgically absent. No intrahepatic or extrahepatic biliary dilation. Pancreas: No focal mass lesion. No dilatation of the main duct. No intraparenchymal cyst. No peripancreatic edema. Spleen: No splenomegaly. No suspicious focal mass lesion. Adrenals/Urinary Tract: No adrenal nodule or  mass. No stones in either kidney or ureter. No secondary changes in either kidney or ureter. No bladder stones. Stomach/Bowel: Stomach is unremarkable. No gastric wall thickening. No evidence of outlet obstruction. Duodenum is normally positioned as is the ligament of Treitz. No small bowel wall thickening. No small bowel dilatation. The terminal ileum is normal. The appendix is normal. No gross colonic mass. No colonic wall thickening. Diverticuli are seen scattered along the entire length of the colon without CT findings of diverticulitis. Vascular/Lymphatic: There is mild atherosclerotic calcification of the abdominal aorta without aneurysm. There is no gastrohepatic or hepatoduodenal ligament lymphadenopathy. No retroperitoneal or mesenteric lymphadenopathy. No pelvic sidewall lymphadenopathy. Reproductive: Hysterectomy.  There is no adnexal mass. Other: No intraperitoneal free fluid.  Musculoskeletal: No worrisome lytic or sclerotic osseous abnormality. IMPRESSION: 1. No acute findings in the abdomen or pelvis. Specifically, no findings to explain the patient's history of back pain and recent kidney infection. No urinary stone disease. No secondary changes in either kidney or ureter. 2. Hepatic steatosis. 3. Colonic diverticulosis without diverticulitis. 4.  Aortic Atherosclerosis (ICD10-I70.0). Electronically Signed   By: Kennith Center M.D.   On: 08/31/2023 11:23    Procedures Procedures    Medications Ordered in ED Medications - No data to display  ED Course/ Medical Decision Making/ A&P                                 Medical Decision Making Amount and/or Complexity of Data Reviewed Labs: ordered. Radiology: ordered.  Risk Prescription drug management.   This patient complains of right sided low back pain radiating around her right flank; this involves an extensive number of treatment Options and is a complaint that carries with it a high risk of complications and morbidity. The  differential includes pyelonephritis, renal colic, musculoskeletal, radiculopathy, retroperitoneal bleed  I ordered, reviewed and interpreted labs, which included CBC normal chemistries with slightly elevated creatinine, urinalysis without signs of infection I ordered imaging studies which included CT renal and I independently    visualized and interpreted imaging which showed no acute findings Additional history obtained from patient's husband Previous records obtained and reviewed in epic including recent PCP notes Social determinants considered, no significant barriers Critical Interventions: None  After the interventions stated above, I reevaluated the patient and found patient to be neurologically intact in no distress Admission and further testing considered, no indications for further workup at this time.  Will cover with pain medication and recommended close follow-up with PCP.  Return instructions discussed         Final Clinical Impression(s) / ED Diagnoses Final diagnoses:  Acute right-sided low back pain without sciatica    Rx / DC Orders ED Discharge Orders          Ordered    HYDROcodone-acetaminophen (NORCO/VICODIN) 5-325 MG tablet  Every 6 hours PRN        08/31/23 1205              Terrilee Files, MD 08/31/23 1824

## 2023-08-31 NOTE — Discharge Instructions (Signed)
You were seen in the emergency department for back pain and concern for kidney stone.  Your urinalysis did not show any signs of infection and your CAT scan did not show a kidney stone.  Your kidney function number was slightly worse, please keep hydrated.  We are prescribing a short course of narcotic pain medicine for pain.  Please use caution as it may make you dizzy and constipated.  Schedule a follow-up appointment with your primary care doctor.  Return if any worsening or concerning symptoms

## 2023-08-31 NOTE — ED Notes (Signed)
 ED Provider at bedside.

## 2023-08-31 NOTE — ED Triage Notes (Signed)
Pt arrived via POV c/o back pain for a few weeks. Pt reports being placed on an antibiotic for a "kidney infection" recently and reports she has not had relief. Pt reports she has been unable to urinate as well. Pt concerned of obstructing kidney stone.

## 2023-09-22 ENCOUNTER — Ambulatory Visit: Payer: Medicaid Other | Admitting: Family Medicine

## 2023-09-23 ENCOUNTER — Ambulatory Visit: Payer: Medicaid Other | Admitting: Family Medicine

## 2023-09-23 ENCOUNTER — Encounter: Payer: Self-pay | Admitting: Family Medicine

## 2023-09-23 VITALS — BP 127/76 | HR 76 | Temp 97.5°F | Ht 64.5 in | Wt 302.0 lb

## 2023-09-23 DIAGNOSIS — M1711 Unilateral primary osteoarthritis, right knee: Secondary | ICD-10-CM

## 2023-09-23 DIAGNOSIS — Z6841 Body Mass Index (BMI) 40.0 and over, adult: Secondary | ICD-10-CM | POA: Diagnosis not present

## 2023-09-23 DIAGNOSIS — E88819 Insulin resistance, unspecified: Secondary | ICD-10-CM

## 2023-09-23 DIAGNOSIS — I10 Essential (primary) hypertension: Secondary | ICD-10-CM | POA: Diagnosis not present

## 2023-09-23 DIAGNOSIS — E66813 Obesity, class 3: Secondary | ICD-10-CM

## 2023-09-23 MED ORDER — WEGOVY 2.4 MG/0.75ML ~~LOC~~ SOAJ: 2.4000 mg | SUBCUTANEOUS | 1 refills | Status: DC

## 2023-09-23 MED ORDER — LOSARTAN POTASSIUM 50 MG PO TABS: 50.0000 mg | ORAL_TABLET | Freq: Every day | ORAL | 0 refills | Status: DC

## 2023-09-23 NOTE — Progress Notes (Signed)
 Office: [phone]  /  Fax: [phone]  WEIGHT SUMMARY AND BIOMETRICS  Starting Date: 04/28/23  Starting Weight: 307lb   Weight Lost Since Last Visit: 0lb   Vitals Temp: (!) 97.5 F (36.4 C) BP: 127/76 Pulse Rate: 76 SpO2: 98 %   Body Composition  Body Fat %: 58.1 % Fat Mass (lbs): 175.6 lbs Muscle Mass (lbs): 120.2 lbs Visceral Fat Rating : 23    HPI  Chief Complaint: OBESITY  Jadira is here to discuss her progress with her obesity treatment plan. She is on the the Category 3 Plan and states she is following her eating plan approximately 90 % of the time. She states she is exercising 20 minutes 3 times per week.  Interval History:  Since last office visit she is up 1 lb This gives her a net weight loss of 5 pounds in the past 4 months of medically supervised weight management She had a UTI since her last visit She is feeling a good amount of satiety from [facility] 1.7 mg weekly She rarely eats out She denies adverse SE from [facility] She is off soda and sweets She plans to walk outdoors  R knee pain does limit her with some walking She has already had L TKR done (3 years ago and did great)  Pharmacotherapy: Wegovy 1.7 mg weekly  PHYSICAL EXAM:  Blood pressure 127/76, pulse 76, temperature (!) 97.5 F (36.4 C), height 5' 4.5" (1.638 m), weight (!) 302 lb (137 kg), SpO2 98%. Body mass index is 51.04 kg/m.  General: She is overweight, cooperative, alert, well developed, and in no acute distress. PSYCH: Has normal mood, affect and thought process.   Lungs: Normal breathing effort, no conversational dyspnea.   ASSESSMENT AND PLAN  TREATMENT PLAN FOR OBESITY:  Recommended Dietary Goals  Shakerria is currently in the action stage of change. As such, her goal is to continue weight management plan. She has agreed to the Category 3 Plan.  Behavioral Intervention  We discussed the following Behavioral Modification Strategies today: increasing lean protein  intake to established goals, increasing fiber rich foods, increasing water intake , work on meal planning and preparation, keeping healthy foods at home, work on managing stress, creating time for self-care and relaxation, avoiding temptations and identifying enticing environmental cues, planning for success, and continue to work on maintaining a reduced calorie state, getting the recommended amount of protein, incorporating whole foods, making healthy choices, staying well hydrated and practicing mindfulness when eating..  Additional resources provided today: NA  Recommended Physical Activity Goals  Emmanuella has been advised to work up to 150 minutes of moderate intensity aerobic activity a week and strengthening exercises 2-3 times per week for cardiovascular health, weight loss maintenance and preservation of muscle mass.   She has agreed to Think about enjoyable ways to increase daily physical activity and overcoming barriers to exercise and Increase physical activity in their day and reduce sedentary time (increase NEAT).  Pharmacotherapy changes for the treatment of obesity: Increase Wegovy to 2.4 mg once weekly injection  ASSOCIATED CONDITIONS ADDRESSED TODAY  Osteoarthritis of right knee, unspecified osteoarthritis type Right knee DJD pain continues to limit her walking time.  She is try to get her BMI down in order to have right knee arthroplasty this summer.  She is taking ibuprofen 800 mg twice daily as needed for pain.  Continue active plan for weight reduction.  Begin short walks on level ground with good sneakers.  Follow-up with orthopedics in the spring to  discuss knee arthroplasty.  Class 3 severe obesity due to excess calories with serious comorbidity and body mass index (BMI) of 50.0 to 59.9 in adult [facility])  Essential hypertension Blood pressure has improved.  She is taking 100 mg of losartan in the morning and 50 mg in the evening which has improved her readings.  She is  tolerating this well.  In addition, she is on chlorthalidone 25 mg in the morning.  She is scheduled for follow-up with her PCP -     Losartan Potassium; Take 1 tablet (50 mg total) by mouth daily.  Dispense: 90 tablet; Refill: 0  Insulin resistance She has findings of insulin resistance with a fasting insulin of 20.6. She has been working on reducing her intake of added sugar and refined carbohydrates.  Exercise has been limited due to arthritis pain.  We discussed potentially adding metformin for insulin resistance but she declined at this point in time. Other orders -     Wegovy; Inject 2.4 mg into the skin once a week.  Dispense: 3 mL; Refill: 1      She was informed of the importance of frequent follow up visits to maximize her success with intensive lifestyle modifications for her multiple health conditions.   ATTESTASTION STATEMENTS:  Reviewed by clinician on day of visit: allergies, medications, problem list, medical history, surgical history, family history, social history, and previous encounter notes pertinent to obesity diagnosis.   I have personally spent 30 minutes total time today in preparation, patient care, nutritional counseling and documentation for this visit, including the following: review of clinical lab tests; review of medical tests/procedures/services.      Glennis Brink, DO DABFM, DABOM [facility] [address] [place], [place] [address] [phone]

## 2023-09-28 ENCOUNTER — Telehealth: Payer: Self-pay

## 2023-09-28 NOTE — Telephone Encounter (Signed)
 Patient was referred to our office for UTI with a hx of bladder prolapse. I contacted [facility] to make them aware that our providers are unable to treat a bladder prolapse or see a patient with a hx of bladder prolapse. I made them aware that DR. Scott MacDiarmid in [place] could see the patient. I provided them with the phone and fa x number to ensure they office received the referral.

## 2023-10-15 ENCOUNTER — Ambulatory Visit: Payer: Medicaid Other | Admitting: Family Medicine

## 2023-10-15 ENCOUNTER — Encounter: Payer: Self-pay | Admitting: Family Medicine

## 2023-10-15 VITALS — BP 113/73 | HR 71 | Temp 97.5°F | Ht 64.5 in | Wt 294.0 lb

## 2023-10-15 DIAGNOSIS — Z6841 Body Mass Index (BMI) 40.0 and over, adult: Secondary | ICD-10-CM

## 2023-10-15 DIAGNOSIS — M1711 Unilateral primary osteoarthritis, right knee: Secondary | ICD-10-CM | POA: Diagnosis not present

## 2023-10-15 DIAGNOSIS — I10 Essential (primary) hypertension: Secondary | ICD-10-CM | POA: Diagnosis not present

## 2023-10-15 DIAGNOSIS — E66813 Obesity, class 3: Secondary | ICD-10-CM

## 2023-10-15 DIAGNOSIS — E88819 Insulin resistance, unspecified: Secondary | ICD-10-CM | POA: Diagnosis not present

## 2023-10-15 MED ORDER — WEGOVY 2.4 MG/0.75ML ~~LOC~~ SOAJ
2.4000 mg | SUBCUTANEOUS | 1 refills | Status: DC
Start: 2023-10-15 — End: 2023-11-19

## 2023-10-15 NOTE — Progress Notes (Signed)
 Office: [phone]  /  Fax: [phone]  WEIGHT SUMMARY AND BIOMETRICS  Starting Date: 04/28/23  Starting Weight: 307lb   Weight Lost Since Last Visit: 8lb   Vitals Temp: (!) 97.5 F (36.4 C) BP: 113/73 Pulse Rate: 71 SpO2: 96 %   Body Composition  Body Fat %: 55.4 % Fat Mass (lbs): 162.8 lbs Muscle Mass (lbs): 124.6 lbs Total Body Water (lbs): 97.8 lbs Visceral Fat Rating : 21   HPI  Chief Complaint: OBESITY  Jenna Mccann is here to discuss her progress with her obesity treatment plan. She is on the the Category 3 Plan and states she is following her eating plan approximately 85-90 % of the time. She states she is walking for 30 minutes 3 times per week.  Interval History:  Since last office visit she is down 8 lb She is up 4.4 lb of muscle mass and down 12.8 lb of body fat since last visit This gives her a net weight loss of 13 lb in 5 mos of medically supervised weight management She is liking the improved satiety with Wegovy 2.4 mg (has done 4 injections) She has cut out SSBs Sugar cravings have greatly improved She has been able to reduce portion sizes Her husband has been supportive She is listening to her full cues and has cut back on eating out She is walking more R knee pain is tolerating walking a bit more, looking forward to TKA once her BMI is <40  Pharmacotherapy: Wegovy 2.4 mg weekly  PHYSICAL EXAM:  Blood pressure 113/73, pulse 71, temperature (!) 97.5 F (36.4 C), height 5' 4.5" (1.638 m), weight 294 lb (133.4 kg), SpO2 96%. Body mass index is 49.69 kg/m.  General: She is overweight, cooperative, alert, well developed, and in no acute distress. PSYCH: Has normal mood, affect and thought process.   Lungs: Normal breathing effort, no conversational dyspnea.   ASSESSMENT AND PLAN  TREATMENT PLAN FOR OBESITY:  Recommended Dietary Goals  Jenna Mccann is currently in the action stage of change. As such, her goal is to continue weight management  plan. She has agreed to the Category 3 Plan.  Behavioral Intervention  We discussed the following Behavioral Modification Strategies today: increasing lean protein intake to established goals, increasing fiber rich foods, increasing water intake , work on meal planning and preparation, keeping healthy foods at home, practice mindfulness eating and understand the difference between hunger signals and cravings, avoiding temptations and identifying enticing environmental cues, continue to work on implementation of reduced calorie nutritional plan, and continue to work on maintaining a reduced calorie state, getting the recommended amount of protein, incorporating whole foods, making healthy choices, staying well hydrated and practicing mindfulness when eating..  Additional resources provided today: NA  Recommended Physical Activity Goals  Jenna Mccann has been advised to work up to 150 minutes of moderate intensity aerobic activity a week and strengthening exercises 2-3 times per week for cardiovascular health, weight loss maintenance and preservation of muscle mass.   She has agreed to Increase the intensity, frequency or duration of strengthening exercises  and Increase the intensity, frequency or duration of aerobic exercises    Pharmacotherapy changes for the treatment of obesity: none  ASSOCIATED CONDITIONS ADDRESSED TODAY  Osteoarthritis of right knee, unspecified osteoarthritis type Right knee pain manageable using ibuprofen OTC as needed.  She is awaiting right knee replacement with Dr. Lajoyce Corners, actively working on BMI reduction with a goal BMI under 40.  She has been able to add in some light  walking.  She has done well since her left knee replacement surgery.  She is aiming for summer 2025 for right knee arthroplasty.  Continue active plan for weight reduction, short walks and resistance training as tolerated.  Class 3 severe obesity due to excess calories with serious comorbidity and body mass  index (BMI) of 45.0 to 49.9 in adult [name] -     UXLKGM; Inject 2.4 mg into the skin once a week.  Dispense: 3 mL; Refill: 1 She has done rather well on Wegovy with adequate satiety on the 2.4 mg dose without GI side effects.  She denies meal skipping and is able to maintain her lean muscle mass.  Continue to work on prescribed dietary plan along with Wegovy 2.4 mg once weekly injection.  Essential hypertension Blood pressure has improved over the past several visits.  She is down 13 pounds in 5 months.  She is taking chlorthalidone 25 mg once daily, losartan 50 mg once daily.  Insulin resistance Last fasting insulin elevated at 20.6 on 04/28/2023.  She declined use of metformin but is doing well on Wegovy for obesity management.  She has been able to reduce her intake of starches and sweets.  She is liking the negative GI side effects from [facility] which has helped her sugar.  She has added in small amounts of physical activity.  Plan to recheck fasting insulin in the next 2 months     She was informed of the importance of frequent follow up visits to maximize her success with intensive lifestyle modifications for her multiple health conditions.   ATTESTASTION STATEMENTS:  Reviewed by clinician on day of visit: allergies, medications, problem list, medical history, surgical history, family history, social history, and previous encounter notes pertinent to obesity diagnosis.   I have personally spent 30 minutes total time today in preparation, patient care, nutritional counseling and documentation for this visit, including the following: review of clinical lab tests; review of medical tests/procedures/services.      Glennis Brink, DO DABFM, DABOM [facility] [address] [place], [place] [address] [phone]

## 2023-11-19 ENCOUNTER — Encounter: Payer: Self-pay | Admitting: Family Medicine

## 2023-11-19 ENCOUNTER — Ambulatory Visit: Admitting: Family Medicine

## 2023-11-19 VITALS — BP 113/70 | HR 70 | Temp 97.8°F | Ht 64.5 in | Wt 292.0 lb

## 2023-11-19 DIAGNOSIS — M1711 Unilateral primary osteoarthritis, right knee: Secondary | ICD-10-CM | POA: Diagnosis not present

## 2023-11-19 DIAGNOSIS — Z6841 Body Mass Index (BMI) 40.0 and over, adult: Secondary | ICD-10-CM | POA: Diagnosis not present

## 2023-11-19 DIAGNOSIS — E66813 Obesity, class 3: Secondary | ICD-10-CM

## 2023-11-19 DIAGNOSIS — N8110 Cystocele, unspecified: Secondary | ICD-10-CM | POA: Diagnosis not present

## 2023-11-19 MED ORDER — WEGOVY 2.4 MG/0.75ML ~~LOC~~ SOAJ
2.4000 mg | SUBCUTANEOUS | 1 refills | Status: DC
Start: 2023-11-19 — End: 2023-12-30

## 2023-11-19 NOTE — Progress Notes (Signed)
 Office: [phone]  /  Fax: [phone]  WEIGHT SUMMARY AND BIOMETRICS  Starting Date: 04/28/23  Starting Weight: 307lb   Weight Lost Since Last Visit: 2lb   Vitals Temp: 97.8 F (36.6 C) BP: 113/70 Pulse Rate: 70 SpO2: 96 %   Body Composition  Body Fat %: 57.3 % Fat Mass (lbs): 167.8 lbs Muscle Mass (lbs): 118.6 lbs Visceral Fat Rating : 22    HPI  Chief Complaint: OBESITY  Jenna Mccann is here to discuss her progress with her obesity treatment plan. She is on the the Category 1 Plan and states she is following her eating plan approximately 85 % of the time. She states she is exercising walking 15 minutes 5 times per week.  Interval History:  Since last office visit she is down 2 lb She has been walking more outdoors with some R knee pain She is doing well on Wegovy  2.4 mg weekly She does not track her intake She is down 15 lb in 6 mos of medically supervised weight management This is a 4.8% TBW loss  24 hr food recall Breakfast: 2 eggs with low cal bread + 1 slice of bacon Lunch: grilled chicken + salad greens  Greek yogurt Dinner: grilled chicken + veggies Sugar cravings under good control Portions are smaller with Wegovy  Drinks: water, black coffee  Pharmacotherapy: Wegovy  2.4 mg weekly  PHYSICAL EXAM:  Blood pressure 113/70, pulse 70, temperature 97.8 F (36.6 C), height 5' 4.5" (1.638 m), weight 292 lb (132.5 kg), SpO2 96%. Body mass index is 49.35 kg/m.  General: She is overweight, cooperative, alert, well developed, and in no acute distress. PSYCH: Has normal mood, affect and thought process.   Lungs: Normal breathing effort, no conversational dyspnea.   ASSESSMENT AND PLAN  TREATMENT PLAN FOR OBESITY:  Recommended Dietary Goals  Jenna Mccann is currently in the action stage of change. As such, her goal is to continue weight management plan. She has agreed to the Category 3 Plan and practicing portion control and making smarter food choices,  such as increasing vegetables and decreasing simple carbohydrates.  Behavioral Intervention  We discussed the following Behavioral Modification Strategies today: increasing lean protein intake to established goals, increasing fiber rich foods, increasing water intake , keeping healthy foods at home, avoiding temptations and identifying enticing environmental cues, planning for success, and continue to work on maintaining a reduced calorie state, getting the recommended amount of protein, incorporating whole foods, making healthy choices, staying well hydrated and practicing mindfulness when eating..  Additional resources provided today: NA  Recommended Physical Activity Goals  Jenna Mccann has been advised to work up to 150 minutes of moderate intensity aerobic activity a week and strengthening exercises 2-3 times per week for cardiovascular health, weight loss maintenance and preservation of muscle mass.   She has agreed to Think about enjoyable ways to increase daily physical activity and overcoming barriers to exercise and Increase physical activity in their day and reduce sedentary time (increase NEAT). Add in water exercise 2-3 x a week, chair exercises and increase walking time  Pharmacotherapy changes for the treatment of obesity: none  ASSOCIATED CONDITIONS ADDRESSED TODAY  Osteoarthritis of right knee, unspecified osteoarthritis type R knee pain is tolerating some walking with limitations She is actively working on preop BMI reduction with a target BMI <40 (270 lb or less) She is taking Ibuprofen  800 mg 2 x a day with food for pain and swelling with some relief Denies GI upset.  Renal function is due to be  rechecked by PCP  Continue active plan for weight reduction Add in water exercise 2-3 x a week  Female bladder prolapse New.  She is awaiting a visit with urology.  Reports 2 previous bladder surgeries for prolapse, having problems with metal used to tack in the past.  Bothered by  feeling of bladder falling, incomplete bladder emptying and recurring UTIs.    Keep upcoming visit with urology. Plan to hold Wegovy  2 weeks prior to any surgeries  Class 3 severe obesity due to excess calories with serious comorbidity and body mass index (BMI) of 45.0 to 49.9 in adult (HCC) -     Wegovy ; Inject 2.4 mg into the skin once a week.  Dispense: 3 mL; Refill: 1      She was informed of the importance of frequent follow up visits to maximize her success with intensive lifestyle modifications for her multiple health conditions.   ATTESTASTION STATEMENTS:  Reviewed by clinician on day of visit: allergies, medications, problem list, medical history, surgical history, family history, social history, and previous encounter notes pertinent to obesity diagnosis.   I have personally spent 30 minutes total time today in preparation, patient care, nutritional counseling and education,  and documentation for this visit, including the following: review of most recent clinical lab tests, prescribing medications/ refilling medications, reviewing medical assistant documentation, review and interpretation of bioimpedence results.     Jenna Mccann, D.O. DABFM, DABOM [facility] [address] [place], [place] [address] [phone]

## 2023-12-21 ENCOUNTER — Other Ambulatory Visit: Payer: Self-pay | Admitting: Family Medicine

## 2023-12-21 DIAGNOSIS — I10 Essential (primary) hypertension: Secondary | ICD-10-CM

## 2023-12-30 ENCOUNTER — Encounter: Payer: Self-pay | Admitting: Family Medicine

## 2023-12-30 ENCOUNTER — Ambulatory Visit: Admitting: Family Medicine

## 2023-12-30 ENCOUNTER — Other Ambulatory Visit: Payer: Self-pay | Admitting: Family Medicine

## 2023-12-30 ENCOUNTER — Telehealth (INDEPENDENT_AMBULATORY_CARE_PROVIDER_SITE_OTHER): Payer: Self-pay

## 2023-12-30 VITALS — BP 100/66 | HR 77 | Temp 98.0°F | Ht 64.5 in | Wt 282.0 lb

## 2023-12-30 DIAGNOSIS — E559 Vitamin D deficiency, unspecified: Secondary | ICD-10-CM

## 2023-12-30 DIAGNOSIS — Z6841 Body Mass Index (BMI) 40.0 and over, adult: Secondary | ICD-10-CM | POA: Diagnosis not present

## 2023-12-30 DIAGNOSIS — E785 Hyperlipidemia, unspecified: Secondary | ICD-10-CM | POA: Diagnosis not present

## 2023-12-30 DIAGNOSIS — I10 Essential (primary) hypertension: Secondary | ICD-10-CM

## 2023-12-30 DIAGNOSIS — E66813 Obesity, class 3: Secondary | ICD-10-CM | POA: Diagnosis not present

## 2023-12-30 DIAGNOSIS — M1711 Unilateral primary osteoarthritis, right knee: Secondary | ICD-10-CM

## 2023-12-30 MED ORDER — WEGOVY 2.4 MG/0.75ML ~~LOC~~ SOAJ
2.4000 mg | SUBCUTANEOUS | 1 refills | Status: DC
Start: 2023-12-30 — End: 2024-01-26

## 2023-12-30 NOTE — Telephone Encounter (Signed)
 Your prior authorization for Wegovy  has been approved! More Info Personalized support and financial assistance may be available through the [employer] program. For more information, and to see program requirements, click on the More Info button to the right.  Message from plan: Approved. This drug has been approved. Approved quantity: 3 milliliters per 28 day(s). You may fill up to a 34 day supply at a retail pharmacy. You may fill up to a 90 day supply for maintenance drugs, please refer to the formulary for details. Please call the pharmacy to process your prescription claim.. Authorization Expiration Date: December 29, 2024.

## 2023-12-30 NOTE — Progress Notes (Signed)
 Office: [phone]  /  Fax: [phone]  WEIGHT SUMMARY AND BIOMETRICS  Starting Date: 04/28/23  Starting Weight: 307lb   Weight Lost Since Last Visit: 10lb   Vitals Temp: 98 F (36.7 C) BP: 100/66 Pulse Rate: 77 SpO2: 97 %   Body Composition  Body Fat %: 55 % Fat Mass (lbs): 155.2 lbs Muscle Mass (lbs): 120.4 lbs Total Body Water (lbs): 98.6 lbs Visceral Fat Rating : 20    HPI  Chief Complaint: OBESITY  Jenna Mccann is here to discuss her progress with her obesity treatment plan. She is on the the Category 3 Plan and states she is following her eating plan approximately 80 % of the time. She states she is exercising 30 minutes 7 times per week.  Interval History:  Since last office visit she is down 10 lb She is up 1.8 lb of muscle mass and down 12.6 lb of body fast in the past 5 weeks This gives her a net weight loss of 25 lb in 7 mos of medically supervised weight management This is an 8% TBW loss Feeling adequate satiety from Wegovy  2.4 mg weekly She is eating smaller portions and avoid sweets R knee continues to hurt limiting walking and she is actively working on Bmi reduction for TKR She denies meal skipping  Pharmacotherapy: Wegovy  2.4 mg weekly  PHYSICAL EXAM:  Blood pressure 100/66, pulse 77, temperature 98 F (36.7 C), height 5' 4.5" (1.638 m), weight 282 lb (127.9 kg), SpO2 97%. Body mass index is 47.66 kg/m.  General: She is overweight, cooperative, alert, well developed, and in no acute distress. PSYCH: Has normal mood, affect and thought process.   Lungs: Normal breathing effort, no conversational dyspnea.   ASSESSMENT AND PLAN  TREATMENT PLAN FOR OBESITY:  Recommended Dietary Goals  Jenna Mccann is currently in the action stage of change. As such, her goal is to continue weight management plan. She has agreed to the Category 3 Plan.  Behavioral Intervention  We discussed the following Behavioral Modification Strategies today: increasing  lean protein intake to established goals, increasing fiber rich foods, increasing water intake , work on meal planning and preparation, reading food labels , practice mindfulness eating and understand the difference between hunger signals and cravings, avoiding temptations and identifying enticing environmental cues, planning for success, and continue to work on maintaining a reduced calorie state, getting the recommended amount of protein, incorporating whole foods, making healthy choices, staying well hydrated and practicing mindfulness when eating..  Additional resources provided today: NA  Recommended Physical Activity Goals  Jenna Mccann has been advised to work up to 150 minutes of moderate intensity aerobic activity a week and strengthening exercises 2-3 times per week for cardiovascular health, weight loss maintenance and preservation of muscle mass.   She has agreed to Think about enjoyable ways to increase daily physical activity and overcoming barriers to exercise and Increase physical activity in their day and reduce sedentary time (increase NEAT). Work on small walks, chair exercise, water exercise 5 days/ wk  Pharmacotherapy changes for the treatment of obesity: none  ASSOCIATED CONDITIONS ADDRESSED TODAY  Vitamin D  deficiency Last vitamin D  Lab Results  Component Value Date   VD25OH 35.5 04/28/2023  Taking OTC vitamin D  5,000 international units  daily Goal vitamin D  is >50 Repeat lab today  -     VITAMIN D  25 Hydroxy (Vit-D Deficiency, Fractures)  Class 3 severe obesity due to excess calories with serious comorbidity and body mass index (BMI) of 45.0 to 49.9  in adult -     Wegovy ; Inject 2.4 mg into the skin once a week.  Dispense: 3 mL; Refill: 1 Benefit exceeds risk in continuing Wegovy  2.4 mg weekly Did well regaining muscle mass with some regular physical activity and improving protein intake  Essential hypertension BP is low- normal on Chlorthalidone 25 mg daily,  losartan  50 mg daily Reduce Chlorthalidone to 1/2 tab daily Likely with weight loss, BP is improving Hydrate well with water with a  goal of 100 oz/ day -     Comprehensive metabolic panel with GFR  Osteoarthritis of right knee, unspecified osteoarthritis type Unchanged R knee DJD pain unchanged She has not tried water exercise or chair exercise She is actively working on BMI reduction for R TKR with Dr Julio Ohm  Dyslipidemia Lab Results  Component Value Date   CHOL 214 (H) 04/28/2023   HDL 55 04/28/2023   LDLCALC 132 (H) 04/28/2023   TRIG 150 (H) 04/28/2023   CHOLHDL 3.9 04/28/2023     She was not fasting today to repeat FLP but she has lost 8% TBW over the past 7 mos, reducing intake of sugar and saturated fat.  She is not on statin therapy  Repeat FLP with PCP next month  The 10-year ASCVD risk score (Arnett DK, et al., 2019) is: 3.2%   Values used to calculate the score:     Age: 63 years     Sex: Female     Is Non-Hispanic African American: No     Diabetic: No     Tobacco smoker: No     Systolic Blood Pressure: 100 mmHg     Is BP treated: Yes     HDL Cholesterol: 55 mg/dL     Total Cholesterol: 214 mg/dL    She was informed of the importance of frequent follow up visits to maximize her success with intensive lifestyle modifications for her multiple health conditions.   ATTESTASTION STATEMENTS:  Reviewed by clinician on day of visit: allergies, medications, problem list, medical history, surgical history, family history, social history, and previous encounter notes pertinent to obesity diagnosis.   I have personally spent 30 minutes total time today in preparation, patient care, nutritional counseling and education,  and documentation for this visit, including the following: review of most recent clinical lab tests, prescribing medications/ refilling medications, reviewing medical assistant documentation, review and interpretation of bioimpedence results.     Micky Albee, D.O. DABFM, DABOM [facility] [address] [place], [place] [address] [phone]

## 2024-01-02 LAB — VITAMIN D 25 HYDROXY (VIT D DEFICIENCY, FRACTURES): Vit D, 25-Hydroxy: 54.8 ng/mL (ref 30.0–100.0)

## 2024-01-02 LAB — COMPREHENSIVE METABOLIC PANEL WITH GFR
ALT: 17 IU/L (ref 0–32)
AST: 26 IU/L (ref 0–40)
Albumin: 4.6 g/dL (ref 3.9–4.9)
Alkaline Phosphatase: 69 IU/L (ref 44–121)
BUN/Creatinine Ratio: 15 (ref 12–28)
BUN: 26 mg/dL (ref 8–27)
Bilirubin Total: 0.3 mg/dL (ref 0.0–1.2)
CO2: 21 mmol/L (ref 20–29)
Calcium: 10.1 mg/dL (ref 8.7–10.3)
Chloride: 96 mmol/L (ref 96–106)
Creatinine, Ser: 1.71 mg/dL — ABNORMAL HIGH (ref 0.57–1.00)
Globulin, Total: 2.8 g/dL (ref 1.5–4.5)
Glucose: 77 mg/dL (ref 70–99)
Potassium: 4.3 mmol/L (ref 3.5–5.2)
Sodium: 138 mmol/L (ref 134–144)
Total Protein: 7.4 g/dL (ref 6.0–8.5)
eGFR: 34 mL/min/{1.73_m2} — ABNORMAL LOW (ref >59)

## 2024-01-04 ENCOUNTER — Ambulatory Visit: Payer: Self-pay | Admitting: Family Medicine

## 2024-01-10 ENCOUNTER — Other Ambulatory Visit: Payer: Self-pay | Admitting: Family Medicine

## 2024-01-26 ENCOUNTER — Encounter: Payer: Self-pay | Admitting: Family Medicine

## 2024-01-26 ENCOUNTER — Ambulatory Visit: Admitting: Family Medicine

## 2024-01-26 VITALS — BP 95/64 | HR 76 | Ht 64.5 in | Wt 276.0 lb

## 2024-01-26 DIAGNOSIS — E559 Vitamin D deficiency, unspecified: Secondary | ICD-10-CM | POA: Diagnosis not present

## 2024-01-26 DIAGNOSIS — E66813 Obesity, class 3: Secondary | ICD-10-CM

## 2024-01-26 DIAGNOSIS — N1832 Chronic kidney disease, stage 3b: Secondary | ICD-10-CM

## 2024-01-26 DIAGNOSIS — Z6841 Body Mass Index (BMI) 40.0 and over, adult: Secondary | ICD-10-CM | POA: Diagnosis not present

## 2024-01-26 DIAGNOSIS — I10 Essential (primary) hypertension: Secondary | ICD-10-CM

## 2024-01-26 DIAGNOSIS — M1711 Unilateral primary osteoarthritis, right knee: Secondary | ICD-10-CM | POA: Diagnosis not present

## 2024-01-26 DIAGNOSIS — I129 Hypertensive chronic kidney disease with stage 1 through stage 4 chronic kidney disease, or unspecified chronic kidney disease: Secondary | ICD-10-CM | POA: Diagnosis not present

## 2024-01-26 DIAGNOSIS — N8110 Cystocele, unspecified: Secondary | ICD-10-CM

## 2024-01-26 MED ORDER — WEGOVY 2.4 MG/0.75ML ~~LOC~~ SOAJ: 2.4000 mg | SUBCUTANEOUS | 1 refills | Status: DC

## 2024-01-26 NOTE — Patient Instructions (Signed)
 STOP Chlorthalidone -- BP is coming down!

## 2024-01-26 NOTE — Progress Notes (Signed)
 Office: [phone]  /  Fax: [phone]  WEIGHT SUMMARY AND BIOMETRICS  Starting Date: 07/28/22 Error: start date 04/28/23 Starting Weight: 307lb   Weight Lost Since Last Visit: 6lb   Vitals BP: 95/64 Pulse Rate: 76 SpO2: 98 %   Body Composition  Body Fat %: 55.2 % Fat Mass (lbs): 152.4 lbs Muscle Mass (lbs): 117.4 lbs Visceral Fat Rating : 20   HPI  Chief Complaint: OBESITY  Jenna Mccann is here to discuss her progress with her obesity treatment plan. She is on the the Category 3 Plan and states she is following her eating plan approximately 100 % of the time. She states she is walking for 15-20 minutes 7 times per week.  Interval History:  Since last office visit she is down 6 lb She is net down 31 lb in the past 8 mos of medically supervised weight management This is a 10% total body weight loss She is seeing Dr Harden back re: R knee arthroplasty-- actively working on BMI reduction She has adequate satiety with Wegovy  2.4 mg weekly She is doing a little walking, limited by right knee pain She is able to get in lean protein and produce with meals She is not skipping meals Of her total body weight lost, 7.4 pounds has been muscle mass.  This is a 22% of her total body weight loss.  Pharmacotherapy: Wegovy  2.4 mg once weekly injection  PHYSICAL EXAM:  Blood pressure 95/64, pulse 76, height 5' 4.5 (1.638 m), weight 276 lb (125.2 kg), SpO2 98%. Body mass index is 46.64 kg/m.  General: She is overweight, cooperative, alert, well developed, and in no acute distress. PSYCH: Has normal mood, affect and thought process.   Lungs: Normal breathing effort, no conversational dyspnea.  ASSESSMENT AND PLAN  TREATMENT PLAN FOR OBESITY:  Recommended Dietary Goals  Jenna Mccann is currently in the action stage of change. As such, her goal is to continue weight management plan. She has agreed to the Category 3 Plan.  Behavioral Intervention  We discussed the following  Behavioral Modification Strategies today: increasing lean protein intake to established goals, increasing fiber rich foods, avoiding skipping meals, increasing water intake , keeping healthy foods at home, avoiding temptations and identifying enticing environmental cues, planning for success, and continue to work on maintaining a reduced calorie state, getting the recommended amount of protein, incorporating whole foods, making healthy choices, staying well hydrated and practicing mindfulness when eating..  Additional resources provided today: NA  Recommended Physical Activity Goals  Jenna Mccann has been advised to work up to 150 minutes of moderate intensity aerobic activity a week and strengthening exercises 2-3 times per week for cardiovascular health, weight loss maintenance and preservation of muscle mass.   She has agreed to Increase the intensity, frequency or duration of aerobic exercises    Pharmacotherapy changes for the treatment of obesity: None  ASSOCIATED CONDITIONS ADDRESSED TODAY  Osteoarthritis of right knee, unspecified osteoarthritis type Unchanged.  She has seen slight improvements in right knee pain with 31 pounds of weight loss in the past 8 months.  She is meeting with Dr. Harden on 7/30 to discuss right knee arthroplasty.  She is actively working on BMI reduction.  She has been taking ibuprofen  1-2 times a day.  We reviewed her most recent elevated creatinine of 1.71 with a GFR of 34.  Her lab results were printed for her to bring into both Ortho and PCP.  Class 3 severe obesity due to excess calories with serious comorbidity and  body mass index (BMI) of 45.0 to 49.9 in adult -     Wegovy ; Inject 2.4 mg into the skin once a week.  Dispense: 3 mL; Refill: 1 She is seeing adequate improvement in satiety and weight loss using Wegovy  2.4 mg once weekly injection.  She has lost 22% of her total body weight loss in muscle mass which is acceptable.  She is prioritizing lean protein with  meals.  She understands the need to hold her Wegovy  for 2 weeks prior to general anesthesia.  Essential hypertension Her blood pressure has improved.  She denies any symptoms of presyncope.  Her blood pressures have been running lower currently on chlorthalidone 12.5 mg daily and losartan  50 mg daily.  Will discontinue her chlorthalidone.  Follow-up with her PCP.  Vitamin D  deficiency Last vitamin D  Lab Results  Component Value Date   VD25OH 54.8 12/30/2023  Reviewed labs with patient from last visit.  Her vitamin D  level has improved on vitamin D  5000 IU once daily.  Energy level has also improved.  Will continue her vitamin D  5000 IU once daily.  CKD stage 3b, GFR 30-44 ml/min (HCC) Unchanged.  Reviewed her most recent creatinine from last visit.  Her GFR is 34 with a creatinine of 1.71, down from 1.21 previously.  She has been taking prescription ibuprofen  per her PCP for arthritis pain.  She has noted incomplete bladder emptying as well and has an upcoming visit with [facility] on 7/8.   Female bladder prolapse Keep upcoming visit with Dr. Annie re: incomplete bladder emptying with rise in serum creatinine.  She denies frequent UTIs.     She was informed of the importance of frequent follow up visits to maximize her success with intensive lifestyle modifications for her multiple health conditions.   ATTESTASTION STATEMENTS:  Reviewed by clinician on day of visit: allergies, medications, problem list, medical history, surgical history, family history, social history, and previous encounter notes pertinent to obesity diagnosis.   I have personally spent 30 minutes total time today in preparation, patient care, nutritional counseling and education,  and documentation for this visit, including the following: review of most recent clinical lab tests, prescribing medications/ refilling medications, reviewing medical assistant documentation, review and interpretation of  bioimpedence results.     Jenna Mccann, D.O. DABFM, DABOM [facility] [address] [place], [place] [address] [phone]

## 2024-01-27 ENCOUNTER — Ambulatory Visit: Admitting: Family Medicine

## 2024-01-28 ENCOUNTER — Ambulatory Visit (INDEPENDENT_AMBULATORY_CARE_PROVIDER_SITE_OTHER): Admitting: Orthopedic Surgery

## 2024-01-28 VITALS — Ht 64.5 in | Wt 276.0 lb

## 2024-01-28 DIAGNOSIS — M1711 Unilateral primary osteoarthritis, right knee: Secondary | ICD-10-CM

## 2024-01-28 DIAGNOSIS — G8929 Other chronic pain: Secondary | ICD-10-CM | POA: Diagnosis not present

## 2024-01-28 DIAGNOSIS — M25561 Pain in right knee: Secondary | ICD-10-CM

## 2024-02-01 ENCOUNTER — Encounter: Payer: Self-pay | Admitting: Orthopedic Surgery

## 2024-02-01 NOTE — Progress Notes (Signed)
 Office Visit Note   Patient: Jenna Mccann           Date of Birth: [birth year]           MRN: [id number] Visit Date: 01/28/2024              Requested by: [place], O'Laf, PA-C [address] [place],  [place] [address] PCP: Carol Catalan, PA-C  Chief Complaint  Patient presents with   Right Knee - Pain      HPI: Patient is a 63 year old woman who is status post left total knee arthroplasty.  She states that this knee is asymptomatic.  Patient has been having increasing pain in the right knee.  Patient states that her BMI is currently 14 and she has lost 70 pounds.  Assessment & Plan: Visit Diagnoses:  1. Chronic pain of right knee   2. Arthritis of right knee     Plan: Plan to follow-up in 3 months.  Anticipate if her BMI is below 40 we could proceed with total knee arthroplasty on the right.  Patient has had no relief with injections in the past.  Follow-Up Instructions: Return in about 3 months (around 04/29/2024).   Ortho Exam  Patient is alert, oriented, no adenopathy, well-dressed, normal affect, normal respiratory effort. Examination with walking patient has a valgus alignment to the right knee she has pain palpation over the lateral joint line close the cruciates are stable there is a mild effusion    Imaging: No results found. No images are attached to the encounter.  Labs: Lab Results  Component Value Date   HGBA1C 5.0 04/28/2023   HGBA1C 5.1 03/03/2015     Lab Results  Component Value Date   ALBUMIN 4.6 12/30/2023   ALBUMIN 4.2 08/31/2023   ALBUMIN 4.4 04/28/2023    No results found for: MG Lab Results  Component Value Date   VD25OH 54.8 12/30/2023   VD25OH 35.5 04/28/2023    No results found for: PREALBUMIN    Latest Ref Rng & Units 08/31/2023   10:04 AM 04/28/2023   11:40 AM 03/11/2021   11:11 AM  CBC EXTENDED  WBC 4.0 - 10.5 K/uL 7.5  5.6  6.3   RBC 3.87 - 5.11 MIL/uL 4.53  4.65  4.21   Hemoglobin 12.0 - 15.0 g/dL 86.8   86.7  87.8   HCT 36.0 - 46.0 % 39.9  40.9  37.4   Platelets 150 - 400 K/uL 362  275  248   NEUT# 1.7 - 7.7 K/uL 4.4  3.7    Lymph# 0.7 - 4.0 K/uL 2.2  1.3       Body mass index is 46.64 kg/m.  Orders:  No orders of the defined types were placed in this encounter.  No orders of the defined types were placed in this encounter.    Procedures: No procedures performed  Clinical Data: No additional findings.  ROS:  All other systems negative, except as noted in the HPI. Review of Systems  Objective: Vital Signs: Ht 5' 4.5 (1.638 m)   Wt 276 lb (125.2 kg)   BMI 46.64 kg/m   Specialty Comments:  No specialty comments available.  PMFS History: Patient Active Problem List   Diagnosis Date Noted   CKD stage 3b, GFR 30-44 ml/min (HCC) 01/26/2024   Urinary incontinence in female 08/26/2023   Class 3 severe obesity due to excess calories with serious comorbidity and body mass index (BMI) of 50.0 to 59.9 in  adult 07/23/2023   Insulin  resistance 05/26/2023   Hyperkalemia 05/26/2023   Vitamin D  deficiency 05/26/2023   Other fatigue 04/28/2023   SOBOE (shortness of breath on exertion) 04/28/2023   Osteoarthritis of right knee 04/28/2023   Essential hypertension 04/28/2023   Polyphagia 04/28/2023   Depression screen 04/28/2023   BMI 50.0-59.9, adult (HCC) 04/28/2023   Primary osteoarthritis of both knees 04/16/2023   Primary hypertension 04/16/2023   Arthritis of left knee 03/15/2021   Acute pericarditis 03/04/2015   Morbid obesity (HCC) 03/04/2015   Normocytic anemia 03/04/2015   Abnormal EKG 03/02/2015   Chest pain    Frequent UTI 07/05/2014   Incomplete emptying of bladder 07/05/2014   Dyslipidemia 06/29/2014   Tubular adenoma of colon 05/09/2014   Colon, diverticulosis 05/06/2014   Diverticulosis of large intestine without hemorrhage 05/06/2014   Morbid obesity with BMI of 40.0-44.9, adult (HCC) 03/27/2014   Past Medical History:  Diagnosis Date   Anxiety     Arthritis    Diverticulitis    Edema lower legs    Gallbladder disease    History of cardiac catheterization    LHC during admit for pericarditis 8/16:  normal coronary arteries   History of echocardiogram    a. Echo 03/03/15:  mild to mod LVH, EF 55-60%, no RWMA, mildly dilated aortic root, mild LAE   Hypertension    Normocytic anemia    Pericarditis 02/26/2015   Rheumatoid arthritis (HCC)    Vitamin D  deficiency     Family History  Problem Relation Age of Onset   Cancer Mother    Diabetes Mother    Heart disease Father     Past Surgical History:  Procedure Laterality Date   ABDOMINAL HYSTERECTOMY     APPLICATION OF WOUND VAC  03/15/2021   Procedure: APPLICATION OF WOUND VAC;  Surgeon: Harden Jerona GAILS, MD;  Location: [facility] OR;  Service: Orthopedics;;   BACK SURGERY     CARDIAC CATHETERIZATION N/A 03/02/2015   Procedure: Left Heart Cath and Coronary Angiography;  Surgeon: Victory LELON Sharps, MD;  Location: [facility] INVASIVE CV LAB;  Service: Cardiovascular;  Laterality: N/A;   CHOLECYSTECTOMY     ROTATOR CUFF REPAIR Right    TOTAL KNEE ARTHROPLASTY Left 03/15/2021   Procedure: LEFT TOTAL KNEE ARTHROPLASTY;  Surgeon: Harden Jerona GAILS, MD;  Location: [facility] OR;  Service: Orthopedics;  Laterality: Left;   Social History   Occupational History   Not on file  Tobacco Use   Smoking status: Never   Smokeless tobacco: Never  Vaping Use   Vaping status: Never Used  Substance and Sexual Activity   Alcohol use: No   Drug use: Never   Sexual activity: Not Currently

## 2024-03-16 ENCOUNTER — Encounter: Payer: Self-pay | Admitting: Family Medicine

## 2024-03-16 ENCOUNTER — Ambulatory Visit: Admitting: Family Medicine

## 2024-03-16 VITALS — BP 111/74 | HR 79 | Temp 98.0°F | Ht 64.5 in | Wt 272.0 lb

## 2024-03-16 DIAGNOSIS — Z6841 Body Mass Index (BMI) 40.0 and over, adult: Secondary | ICD-10-CM

## 2024-03-16 DIAGNOSIS — E66813 Obesity, class 3: Secondary | ICD-10-CM | POA: Diagnosis not present

## 2024-03-16 DIAGNOSIS — N1832 Chronic kidney disease, stage 3b: Secondary | ICD-10-CM | POA: Diagnosis not present

## 2024-03-16 DIAGNOSIS — I10 Essential (primary) hypertension: Secondary | ICD-10-CM

## 2024-03-16 DIAGNOSIS — M1711 Unilateral primary osteoarthritis, right knee: Secondary | ICD-10-CM

## 2024-03-16 DIAGNOSIS — N8110 Cystocele, unspecified: Secondary | ICD-10-CM

## 2024-03-16 DIAGNOSIS — I129 Hypertensive chronic kidney disease with stage 1 through stage 4 chronic kidney disease, or unspecified chronic kidney disease: Secondary | ICD-10-CM

## 2024-03-16 MED ORDER — WEGOVY 2.4 MG/0.75ML ~~LOC~~ SOAJ: 2.4000 mg | SUBCUTANEOUS | 1 refills | Status: DC

## 2024-03-16 NOTE — Patient Instructions (Signed)
 Cereal options: Special K Protein  [employer] Protein cereal + [employer]Life milk (lowfat)  +/- a fruit like 1/2 banana or berries  Lunch: remember you get ONE carb serving Don't skip the protein with lunch  Dinner: keep low carb Lean meat/ fish + non starchy veggies  Add in one snack (afternoon or evening) - protein shake or a greek yogurt  - 100-150 cal and 15+ g of protein, < 8 g of sugar  Add in resistance training from home 10 min daily (Bands, body weight training )

## 2024-03-16 NOTE — Progress Notes (Signed)
 Office: [phone]  /  Fax: [phone]  WEIGHT SUMMARY AND BIOMETRICS  Starting Date: 07/28/22  Starting Weight: 307lb   Weight Lost Since Last Visit: 4lb   Vitals Temp: 98 F (36.7 C) BP: 111/74 Pulse Rate: 79 SpO2: 98 %   Body Composition  Body Fat %: 55.4 % Fat Mass (lbs): 151 lbs Muscle Mass (lbs): 115.2 lbs Visceral Fat Rating : 20     HPI  Chief Complaint: OBESITY  Jenna Mccann is here to discuss her progress with her obesity treatment plan. She is on the the Category 1 Plan and states she is following her eating plan approximately 95 % of the time. She states she is walking for 1 hour everyday.    Interval History:  Since last office visit she is down 4 lb This gives her a net weight loss of 35 pounds in 10 months medically supervised weight management This is an 11.4% total body weight loss using a reduced calorie diet in addition to Wegovy  She is feeling adequate satiety from Wegovy  2.4 mg weekly She is not tracking her calories She is able to walk about 2 miles just about daily  Her R knee DJD pain continues to limit her  She is looking into R knee arthroplasty with Dr Harden, has f/u in October, actively working on BMI reduction  24 hr food recall: B: special K 1.5 cup + skim milk OR 2 eggs + 1-2 slices of low cal toast L:grilled chicken 6-8 oz + green beans D: grilled chicken + 1 sweet potato  Snacks : apple or jarred peaches Drinks: water, coffee (black)  Pharmacotherapy: Wegovy  2.4 mg once weekly injections  PHYSICAL EXAM:  Blood pressure 111/74, pulse 79, temperature 98 F (36.7 C), height 5' 4.5 (1.638 m), weight 272 lb (123.4 kg), SpO2 98%. Body mass index is 45.97 kg/m.  General: She is overweight, cooperative, alert, well developed, and in no acute distress. PSYCH: Has normal mood, affect and thought process.   Lungs: Normal breathing effort, no conversational dyspnea.   ASSESSMENT AND PLAN  TREATMENT PLAN FOR  OBESITY:  Recommended Dietary Goals  Jenna Mccann is currently in the action stage of change. As such, her goal is to continue weight management plan. She has agreed to the Category 2 Plan. Reviewed dietary change goals together and after visit summary  Behavioral Intervention  We discussed the following Behavioral Modification Strategies today: increasing lean protein intake to established goals, increasing fiber rich foods, increasing water intake , work on meal planning and preparation, work on tracking and journaling calories using tracking application, reading food labels , keeping healthy foods at home, planning for success, and continue to work on maintaining a reduced calorie state, getting the recommended amount of protein, incorporating whole foods, making healthy choices, staying well hydrated and practicing mindfulness when eating..  Additional resources provided today: NA  Recommended Physical Activity Goals  Jenna Mccann has been advised to work up to 150 minutes of moderate intensity aerobic activity a week and strengthening exercises 2-3 times per week for cardiovascular health, weight loss maintenance and preservation of muscle mass.   She has agreed to Think about enjoyable ways to increase daily physical activity and overcoming barriers to exercise, Increase physical activity in their day and reduce sedentary time (increase NEAT)., and Start strengthening exercises with a goal of 2-3 sessions a week  Begin resistance training exercises from home 10 minutes daily  Pharmacotherapy changes for the treatment of obesity: None  ASSOCIATED CONDITIONS ADDRESSED TODAY  Osteoarthritis of right knee, unspecified osteoarthritis type Unchanged. Continue to work on pre op BMI reduction Limit use of NSAIDs given CKD Suggest recumbent bike or water exercises 3 days/ wk  Class 3 severe obesity due to excess calories with serious comorbidity and body mass index (BMI) of 45.0 to 49.9 in adult -      Wegovy ; Inject 2.4 mg into the skin once a week.  Dispense: 3 mL; Refill: 1 Doing well on Wegovy  without adverse SE or meal skipping Weight loss on pace  Female bladder prolapse Improved post bladder sling surgery done on 7/23, healing well and did pause her Wegovy  for 2 weeks for this surgery.  She is now able to hydrate better with water  CKD stage 3b, GFR 30-44 ml/min (HCC) Lab Results  Component Value Date   CREATININE 1.71 (H) 12/30/2023   Plan to repeat renal function if not done by PCP by next visit  Essential hypertension BP is well controlled on Losartan  50 mg daily and she is now off of Chlorthalidone Continue to limit high sodium foods and continue to work on weight loss.   She was informed of the importance of frequent follow up visits to maximize her success with intensive lifestyle modifications for her multiple health conditions.   ATTESTASTION STATEMENTS:  Reviewed by clinician on day of visit: allergies, medications, problem list, medical history, surgical history, family history, social history, and previous encounter notes pertinent to obesity diagnosis.   I have personally spent 30 minutes total time today in preparation, patient care, nutritional counseling and education,  and documentation for this visit, including the following: review of most recent clinical lab tests, prescribing medications/ refilling medications, reviewing medical assistant documentation, review and interpretation of bioimpedence results.     Darice Haddock, D.O. DABFM, DABOM [facility] [address] [place], [place] [address] [phone]

## 2024-04-19 ENCOUNTER — Ambulatory Visit: Admitting: Family Medicine

## 2024-04-19 ENCOUNTER — Encounter: Payer: Self-pay | Admitting: Family Medicine

## 2024-04-19 VITALS — BP 101/68 | HR 77 | Temp 98.2°F | Ht 64.5 in | Wt 266.0 lb

## 2024-04-19 DIAGNOSIS — N1832 Chronic kidney disease, stage 3b: Secondary | ICD-10-CM

## 2024-04-19 DIAGNOSIS — I129 Hypertensive chronic kidney disease with stage 1 through stage 4 chronic kidney disease, or unspecified chronic kidney disease: Secondary | ICD-10-CM | POA: Diagnosis not present

## 2024-04-19 DIAGNOSIS — E66813 Obesity, class 3: Secondary | ICD-10-CM

## 2024-04-19 DIAGNOSIS — M1711 Unilateral primary osteoarthritis, right knee: Secondary | ICD-10-CM | POA: Diagnosis not present

## 2024-04-19 DIAGNOSIS — Z6841 Body Mass Index (BMI) 40.0 and over, adult: Secondary | ICD-10-CM | POA: Diagnosis not present

## 2024-04-19 DIAGNOSIS — I10 Essential (primary) hypertension: Secondary | ICD-10-CM

## 2024-04-19 MED ORDER — WEGOVY 2.4 MG/0.75ML ~~LOC~~ SOAJ: 2.4000 mg | SUBCUTANEOUS | 0 refills | Status: DC

## 2024-04-19 MED ORDER — LOSARTAN POTASSIUM 50 MG PO TABS: ORAL_TABLET | ORAL | Status: DC

## 2024-04-19 NOTE — Progress Notes (Signed)
 Office: [phone]  /  Fax: [phone]  WEIGHT SUMMARY AND BIOMETRICS  Starting Date: 07/28/22  Starting Weight: 307lb   Weight Lost Since Last Visit: 6lb   Vitals Temp: 98.2 F (36.8 C) BP: 101/68 Pulse Rate: 77 SpO2: 97 %   Body Composition  Body Fat %: 54.9 % Fat Mass (lbs): 146.2 lbs Muscle Mass (lbs): 114 lbs Visceral Fat Rating : 19     HPI  Chief Complaint: OBESITY  Jenna Mccann is here to discuss her progress with her obesity treatment plan. She is on the the Category 1 Plan and states she is following her eating plan approximately 100 % of the time. She states she is exercising 45 minutes 7 times per week.   Interval History:  Since last office visit she is down 6 lb She has a net weight loss of 41 lb in 11 mos  This is an 13.3% TBW loss She plans to see Dr Harden 10/6 to discuss R knee arthroplasty She is walking up to 45 min daily with some R knee pain She has enjoyed added satiety from Wegovy  2.4 mg once weekly injection She denies meal skipping or GI upset She has limited her intake of sweets and has been doing better with meal planning and lean protein intake  Pharmacotherapy: Wegovy  2.4 mg once weekly injection  PHYSICAL EXAM:  Blood pressure 101/68, pulse 77, temperature 98.2 F (36.8 C), height 5' 4.5 (1.638 m), weight 266 lb (120.7 kg), SpO2 97%. Body mass index is 44.95 kg/m.  General: She is overweight, cooperative, alert, well developed, and in no acute distress. PSYCH: Has normal mood, affect and thought process.   Lungs: Normal breathing effort, no conversational dyspnea.  ASSESSMENT AND PLAN  TREATMENT PLAN FOR OBESITY:  Recommended Dietary Goals  Jenna Mccann is currently in the action stage of change. As such, her goal is to continue weight management plan. She has agreed to the Category 3 Plan.  Behavioral Intervention  We discussed the following Behavioral Modification Strategies today: increasing lean protein intake to  established goals, increasing fiber rich foods, increasing water intake , work on meal planning and preparation, keeping healthy foods at home, identifying sources and decreasing liquid calories, continue to practice mindfulness when eating, and planning for success.  Additional resources provided today: NA  Recommended Physical Activity Goals  Jenna Mccann has been advised to work up to 150 minutes of moderate intensity aerobic activity a week and strengthening exercises 2-3 times per week for cardiovascular health, weight loss maintenance and preservation of muscle mass.   She has agreed to [employer] strengthening exercises with a goal of 2-3 sessions a week  and Increase the intensity, frequency or duration of aerobic exercises    Pharmacotherapy changes for the treatment of obesity: None  ASSOCIATED CONDITIONS ADDRESSED TODAY  Osteoarthritis of right knee, unspecified osteoarthritis type Right knee pain continues to limit ramping up walking time.  She does plan to meet with Dr. Harden on 10/6 to discuss right knee arthroplasty.  She is actively working on BMI reduction with a goal under 40.  She will hold her Wegovy  for 2 weeks prior to scheduled surgery.  Continue active plan for BMI reduction.  Class 3 severe obesity due to excess calories with serious comorbidity and body mass index (BMI) of 40.0 to 44.9 in adult -     Wegovy ; Inject 2.4 mg into the skin once a week.  Dispense: 9 mL; Refill: 0  Essential hypertension -     Losartan  Potassium;  1/2 tab po daily Blood pressure has improved with weight loss.  She has reduced her losartan  down to 50 mg once daily from 50 mg twice daily and her blood pressure continues to run fairly low.  She is asymptomatic.  Will reduce her losartan  down to 25 mg once daily.  CKD stage 3b, GFR 30-44 ml/min [name] her labs from her PCP drawn 9//25.  Her creatinine did improve to 1.49 with a GFR of 40.  She was previously at a creatinine of 1.71  With a GFR  of 34 and her bladder sling surgery in July.  Follow-up with PCP, hydrating well with water and limit NSAID use.   She was informed of the importance of frequent follow up visits to maximize her success with intensive lifestyle modifications for her multiple health conditions.   ATTESTASTION STATEMENTS:  Reviewed by clinician on day of visit: allergies, medications, problem list, medical history, surgical history, family history, social history, and previous encounter notes pertinent to obesity diagnosis.   I have personally spent 30 minutes total time today in preparation, patient care, nutritional counseling and education,  and documentation for this visit, including the following: review of most recent clinical lab tests, prescribing medications/ refilling medications, reviewing medical assistant documentation, review and interpretation of bioimpedence results.     Jenna Mccann, D.O. DABFM, DABOM [facility] [address] [place], [place] [address] [phone]

## 2024-05-02 ENCOUNTER — Encounter: Payer: Self-pay | Admitting: Orthopedic Surgery

## 2024-05-02 ENCOUNTER — Ambulatory Visit: Admitting: Orthopedic Surgery

## 2024-05-02 VITALS — Ht 64.0 in | Wt 265.0 lb

## 2024-05-02 DIAGNOSIS — M1711 Unilateral primary osteoarthritis, right knee: Secondary | ICD-10-CM

## 2024-05-02 NOTE — Progress Notes (Signed)
 Office Visit Note   Patient: Jenna Mccann           Date of Birth: 1960-11-01           MRN: [id number] Visit Date: 05/02/2024              Requested by: [address] O'Laf, PA-C [address] [place],  [place] [address] PCP: Carol Catalan, PA-C  Chief Complaint  Patient presents with   Right Knee - Follow-up      HPI: Discussed the use of AI scribe software for clinical note transcription with the patient, who gave verbal consent to proceed.  History of Present Illness Jenna Mccann is a 63 year old female with osteoarthritis of the right knee who presents for follow-up.  She has a history of osteoarthritis in her right knee.  She is actively working on weight loss, having reduced her weight from 310 pounds to 265 pounds, aided by the medication Wegovy . Her current BMI is 45, and she is 5 feet 4 inches tall. She is focused on continuing her weight loss journey.     Assessment & Plan: Visit Diagnoses: No diagnosis found.  Plan: Assessment and Plan Assessment & Plan Right knee osteoarthritis Chronic osteoarthritis with valgus alignment, crepitation, tenderness, and effusion. BMI 45, reduced to 265 pounds with Wegovy . Insurance requires BMI 40 for surgery. No benefit from injections. Surgery anticipated in three months with continued weight loss. - Continue Wegovy  for weight loss. - Follow-up in January to assess surgical readiness.      Follow-Up Instructions: No follow-ups on file.   Ortho Exam  Patient is alert, oriented, no adenopathy, well-dressed, normal affect, normal respiratory effort. Physical Exam MEASUREMENTS: Height- 5'4, Weight- 265, BMI- 45.0. MUSCULOSKELETAL: Valgus alignment of right knee, crepitation with range of motion, tenderness to palpation, and effusion in right knee.      Imaging: No results found. No images are attached to the encounter.  Labs: Lab Results  Component Value Date   HGBA1C 5.0 04/28/2023   HGBA1C  5.1 03/03/2015     Lab Results  Component Value Date   ALBUMIN 4.6 12/30/2023   ALBUMIN 4.2 08/31/2023   ALBUMIN 4.4 04/28/2023    No results found for: MG Lab Results  Component Value Date   VD25OH 54.8 12/30/2023   VD25OH 35.5 04/28/2023    No results found for: PREALBUMIN    Latest Ref Rng & Units 08/31/2023   10:04 AM 04/28/2023   11:40 AM 03/11/2021   11:11 AM  CBC EXTENDED  WBC 4.0 - 10.5 K/uL 7.5  5.6  6.3   RBC 3.87 - 5.11 MIL/uL 4.53  4.65  4.21   Hemoglobin 12.0 - 15.0 g/dL 86.8  86.7  87.8   HCT 36.0 - 46.0 % 39.9  40.9  37.4   Platelets 150 - 400 K/uL 362  275  248   NEUT# 1.7 - 7.7 K/uL 4.4  3.7    Lymph# 0.7 - 4.0 K/uL 2.2  1.3       Body mass index is 45.49 kg/m.  Orders:  No orders of the defined types were placed in this encounter.  No orders of the defined types were placed in this encounter.    Procedures: No procedures performed  Clinical Data: No additional findings.  ROS:  All other systems negative, except as noted in the HPI. Review of Systems  Objective: Vital Signs: Ht 5' 4 (1.626 m)   Wt 265 lb (120.2 kg)  BMI 45.49 kg/m   Specialty Comments:  No specialty comments available.  PMFS History: Patient Active Problem List   Diagnosis Date Noted   CKD stage 3b, GFR 30-44 ml/min (HCC) 01/26/2024   Urinary incontinence in female 08/26/2023   Class 3 severe obesity due to excess calories with serious comorbidity and body mass index (BMI) of 50.0 to 59.9 in adult (HCC) 07/23/2023   Insulin  resistance 05/26/2023   Hyperkalemia 05/26/2023   Vitamin D  deficiency 05/26/2023   Other fatigue 04/28/2023   SOBOE (shortness of breath on exertion) 04/28/2023   Osteoarthritis of right knee 04/28/2023   Essential hypertension 04/28/2023   Polyphagia 04/28/2023   Depression screen 04/28/2023   BMI 50.0-59.9, adult (HCC) 04/28/2023   Primary osteoarthritis of both knees 04/16/2023   Primary hypertension 04/16/2023   Arthritis of  left knee 03/15/2021   Acute pericarditis 03/04/2015   Morbid obesity (HCC) 03/04/2015   Normocytic anemia 03/04/2015   Abnormal EKG 03/02/2015   Chest pain    Frequent UTI 07/05/2014   Incomplete emptying of bladder 07/05/2014   Dyslipidemia 06/29/2014   Tubular adenoma of colon 05/09/2014   Colon, diverticulosis 05/06/2014   Diverticulosis of large intestine without hemorrhage 05/06/2014   Morbid obesity with BMI of 40.0-44.9, adult (HCC) 03/27/2014   Past Medical History:  Diagnosis Date   Anxiety    Arthritis    Diverticulitis    Edema lower legs    Gallbladder disease    History of cardiac catheterization    LHC during admit for pericarditis 8/16:  normal coronary arteries   History of echocardiogram    a. Echo 03/03/15:  mild to mod LVH, EF 55-60%, no RWMA, mildly dilated aortic root, mild LAE   Hypertension    Normocytic anemia    Pericarditis 02/26/2015   Rheumatoid arthritis (HCC)    Vitamin D  deficiency     Family History  Problem Relation Age of Onset   Cancer Mother    Diabetes Mother    Heart disease Father     Past Surgical History:  Procedure Laterality Date   ABDOMINAL HYSTERECTOMY     APPLICATION OF WOUND VAC  03/15/2021   Procedure: APPLICATION OF WOUND VAC;  Surgeon: Harden Jerona GAILS, MD;  Location: [facility] OR;  Service: Orthopedics;;   BACK SURGERY     CARDIAC CATHETERIZATION N/A 03/02/2015   Procedure: Left Heart Cath and Coronary Angiography;  Surgeon: Victory LELON Sharps, MD;  Location[facility] INVASIVE CV LAB;  Service: Cardiovascular;  Laterality: N/A;   CHOLECYSTECTOMY     ROTATOR CUFF REPAIR Right    TOTAL KNEE ARTHROPLASTY Left 03/15/2021   Procedure: LEFT TOTAL KNEE ARTHROPLASTY;  Surgeon: Harden Jerona GAILS, MD;  Location: [facility] OR;  Service: Orthopedics;  Laterality: Left;   Social History   Occupational History   Not on file  Tobacco Use   Smoking status: Never   Smokeless tobacco: Never  Vaping Use   Vaping status: Never Used  Substance and Sexual Activity    Alcohol use: No   Drug use: Never   Sexual activity: Not Currently

## 2024-05-03 ENCOUNTER — Encounter: Payer: Self-pay | Admitting: Orthopedic Surgery

## 2024-05-23 ENCOUNTER — Encounter: Payer: Self-pay | Admitting: Family Medicine

## 2024-05-23 ENCOUNTER — Ambulatory Visit: Admitting: Family Medicine

## 2024-05-23 VITALS — BP 108/70 | HR 64 | Ht 64.5 in | Wt 263.0 lb

## 2024-05-23 DIAGNOSIS — M1711 Unilateral primary osteoarthritis, right knee: Secondary | ICD-10-CM

## 2024-05-23 DIAGNOSIS — Z6841 Body Mass Index (BMI) 40.0 and over, adult: Secondary | ICD-10-CM

## 2024-05-23 DIAGNOSIS — N1832 Chronic kidney disease, stage 3b: Secondary | ICD-10-CM

## 2024-05-23 DIAGNOSIS — E88819 Insulin resistance, unspecified: Secondary | ICD-10-CM

## 2024-05-23 DIAGNOSIS — E66813 Obesity, class 3: Secondary | ICD-10-CM | POA: Diagnosis not present

## 2024-05-23 MED ORDER — PHENTERMINE HCL 15 MG PO CAPS: ORAL_CAPSULE | ORAL | 1 refills | Status: DC

## 2024-05-23 NOTE — Patient Instructions (Signed)
 Finish out last Wegovy  injection 7 days after last injection, go ahead and start Phentermine 15 mg 2 x a day Take Phentermine 15 mg capsule 30 min before breakfast and at 2 pm daily (1-2 hrs after lunch)  Call if any problems or questions  Continue to work on mindful eating/ meal planning/ regular exercise Keep sweets out of the house Remember water, fruits and veggies  Repeat fasting labs next visit

## 2024-05-23 NOTE — Progress Notes (Signed)
 Office: [phone]  /  Fax: [phone]  WEIGHT SUMMARY AND BIOMETRICS  Starting Date: 07/28/22  Starting Weight: 307lb   Weight Lost Since Last Visit: 3lb   Vitals BP: 108/70 Pulse Rate: 64 SpO2: 97 %   Body Composition  Body Fat %: 55.6 % Fat Mass (lbs): 146.6 lbs Muscle Mass (lbs): 111.2 lbs Visceral Fat Rating : 19    HPI  Chief Complaint: OBESITY  Jenna Mccann is here to discuss her progress with her obesity treatment plan. She is on the the Category 1 Plan and states she is following her eating plan approximately 90 % of the time. She states she is walking for 45 minutes 7 times per week.  Interval History:  Since last office visit she is down 3 lb This gives her a net weight loss of 44 lb in 12 mos of medically supervised weight management This is a 14.3% total body weight loss She plans to see ortho in Jan for future R TKR, working on pre op BMI reduction She is doing some walking for exercise and is doing some chair exercises She has been using Wegovy  2.4 mg weekly and has one more injection left She has greatly limited sweets Her insurance no longer covers Wegovy  She has done well on phentermine in the past  Pharmacotherapy: Wegovy  2.4 mg once weekly injection  PHYSICAL EXAM:  Blood pressure 108/70, pulse 64, height 5' 4.5 (1.638 m), weight 263 lb (119.3 kg), SpO2 97%. Body mass index is 44.45 kg/m.  General: She is overweight, cooperative, alert, well developed, and in no acute distress. PSYCH: Has normal mood, affect and thought process.   Lungs: Normal breathing effort, no conversational dyspnea.   ASSESSMENT AND PLAN  TREATMENT PLAN FOR OBESITY:  Recommended Dietary Goals  Jenna Mccann is currently in the action stage of change. As such, her goal is to continue weight management plan. She has agreed to the Category 3 Plan.  Behavioral Intervention  We discussed the following Behavioral Modification Strategies today: increasing lean protein  intake to established goals, increasing water intake , keeping healthy foods at home, continue to practice mindfulness when eating, planning for success, continue to work on maintaining a reduced calorie state, getting the recommended amount of protein, incorporating whole foods, making healthy choices, staying well hydrated and practicing mindfulness when eating., and increase protein intake, fibrous foods (25 grams per day for women, 30 grams for men) and water to improve satiety and decrease hunger signals. .  Additional resources provided today: NA  Recommended Physical Activity Goals  Jenna Mccann has been advised to work up to 150 minutes of moderate intensity aerobic activity a week and strengthening exercises 2-3 times per week for cardiovascular health, weight loss maintenance and preservation of muscle mass.   She has agreed to Think about enjoyable ways to increase daily physical activity and overcoming barriers to exercise, Increase physical activity in their day and reduce sedentary time (increase NEAT)., and Start strengthening exercises with a goal of 2-3 sessions a week   Pharmacotherapy changes for the treatment of obesity: Complete last injection of Wegovy  2.4 mg.  7 days after last Wegovy  injection begin phentermine 15 mg capsule twice daily PDMP reviewed Informed consent signed Blood pressure and heart rate are within normal limits  ASSOCIATED CONDITIONS ADDRESSED TODAY  Osteoarthritis of right knee, unspecified osteoarthritis type She has done well following left knee arthroplasty, awaiting further BMI reduction for right knee arthroplasty, managed by Dr. Harden.  Continue short walks, chair exercises and active  BMI reduction.  Class 3 severe obesity due to excess calories with serious comorbidity and body mass index (BMI) of 40.0 to 44.9 in adult (HCC) -     Phentermine HCl; 1 capsule 30 min before breakfast and 1 capsule at 2 pm daily  Dispense: 60 capsule; Refill: 1 She has done  well overall with 44 pounds of weight reduction in 1 year medically supervised weight management which is 14% of her total body weight.  She has been compliant with a reduced calorie healthy diet focused on lean protein and fiber with meals.  Her exercise has been reduced due to ongoing right knee pain with osteoarthritis.  In order to continue her weight loss progress in preparation for right knee arthroplasty in January, start phentermine 15 mg capsule twice daily once Wegovy  prescription has been completed.  She did well on it in the past.  We discussed potential for side effects.  Will closely monitor blood pressure and heart rate.  Insulin  resistance 04/28/2023 fasting insulin  high at 20.6.  Continue to work on reducing added sugar and starch intake.  Repeat fasting insulin  with next labs  CKD stage 3b, GFR 30-44 ml/min (HCC) Kidney function has been stable.  Given active weight loss and change of antiobesity medication, update BMP next visit     She was informed of the importance of frequent follow up visits to maximize her success with intensive lifestyle modifications for her multiple health conditions.   ATTESTASTION STATEMENTS:  Reviewed by clinician on day of visit: allergies, medications, problem list, medical history, surgical history, family history, social history, and previous encounter notes pertinent to obesity diagnosis.   I have personally spent 30 minutes total time today in preparation, patient care, nutritional counseling and education,  and documentation for this visit, including the following: review of most recent clinical lab tests, prescribing medications/ refilling medications, reviewing medical assistant documentation, review and interpretation of bioimpedence results.     Jenna Mccann, D.O. DABFM, DABOM [facility] [address] [place], [place] [address] [phone]

## 2024-05-30 ENCOUNTER — Encounter: Payer: Self-pay | Admitting: Radiology

## 2024-06-20 ENCOUNTER — Encounter: Payer: Self-pay | Admitting: Family Medicine

## 2024-06-20 ENCOUNTER — Ambulatory Visit: Admitting: Family Medicine

## 2024-06-20 VITALS — BP 123/77 | HR 71 | Temp 97.6°F | Ht 64.5 in | Wt 267.0 lb

## 2024-06-20 DIAGNOSIS — M1711 Unilateral primary osteoarthritis, right knee: Secondary | ICD-10-CM | POA: Diagnosis not present

## 2024-06-20 DIAGNOSIS — I10 Essential (primary) hypertension: Secondary | ICD-10-CM | POA: Diagnosis not present

## 2024-06-20 DIAGNOSIS — Z6841 Body Mass Index (BMI) 40.0 and over, adult: Secondary | ICD-10-CM

## 2024-06-20 MED ORDER — PHENTERMINE HCL 37.5 MG PO TABS: 37.5000 mg | ORAL_TABLET | Freq: Every day | ORAL | 0 refills | Status: DC

## 2024-06-20 NOTE — Progress Notes (Signed)
 Office: [phone]  /  Fax: [phone]  WEIGHT SUMMARY AND BIOMETRICS  Starting Date: 07/28/22  Starting Weight: 307lb   Weight Lost Since Last Visit: 0lb   Vitals Temp: 97.6 F (36.4 C) BP: 123/77 Pulse Rate: 71 SpO2: 100 %   Body Composition  Body Fat %: 54.3 % Fat Mass (lbs): 145 lbs Muscle Mass (lbs): 116 lbs Visceral Fat Rating : 19     HPI  Chief Complaint: OBESITY  Jenna Mccann is here to discuss her progress with her obesity treatment plan. She is on the the Category 1 Plan and states she is following her eating plan approximately 60 % of the time. She states she is exercising 20 minutes 7 times per week. She is on cat 3 meal plan  Interval History:  Since last office visit she is up 4 lb She did run out of Wegovy  4 weeks ago and changed to Phentermine  15 mg bid She did not have adverse SE but appetite has increased She has been around more sweets and treats She is able to walk about 20 min per day She is trying to get her BMI <40 for R knee arthroplasty Has an Ortho visit upcoming in January  Pharmacotherapy: Phentermine  15 mg twice daily  PHYSICAL EXAM:  Blood pressure 123/77, pulse 71, temperature 97.6 F (36.4 C), height 5' 4.5 (1.638 m), weight 267 lb (121.1 kg), SpO2 100%. Body mass index is 45.12 kg/m.  General: She is overweight, cooperative, alert, well developed, and in no acute distress. PSYCH: Has normal mood, affect and thought process.   Lungs: Normal breathing effort, no conversational dyspnea.   ASSESSMENT AND PLAN  TREATMENT PLAN FOR OBESITY:  Recommended Dietary Goals  [name] is currently in the action stage of change. As such, her goal is to continue weight management plan. She has agreed to the Category 3 Plan.  Behavioral Intervention  We discussed the following Behavioral Modification Strategies today: increasing lean protein intake to established goals, increasing fiber rich foods, work on tracking and journaling  calories using tracking application, keeping healthy foods at home, continue to practice mindfulness when eating, planning for success, and celebration eating strategies.  Additional resources provided today: NA  Recommended Physical Activity Goals  [name] has been advised to work up to 150 minutes of moderate intensity aerobic activity a week and strengthening exercises 2-3 times per week for cardiovascular health, weight loss maintenance and preservation of muscle mass.   She has agreed to [employer] strengthening exercises with a goal of 2-3 sessions a week  and Increase the intensity, frequency or duration of aerobic exercises    Pharmacotherapy changes for the treatment of obesity: Stop phentermine  15 mg twice daily.  Begin phentermine  37.5 mg tab 30 minutes before breakfast daily  ASSOCIATED CONDITIONS ADDRESSED TODAY  Osteoarthritis of right knee, unspecified osteoarthritis type Right knee pain continues.  She has an upcoming visit with Ortho in January and has actively been working on BMI reduction.  She has net weight loss of 40 pounds in  13 months of medically supervised weight management.  Able to do some light walking. 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  Dispense: 30 tablet; Refill: 0 Blood pressure and heart rate are within normal limits but she failed to see much improvement in appetite control coming off Wegovy  and shifting to phentermine  15 mg twice daily.  She had previously been on phentermine  in the past.  She denies any history of heart arrhythmia.  Increase phentermine  short-term to 37.5 mg tab 30 minutes before breakfast daily.  Call if any problems or questions.  BMI 45.0-49.9, adult [facility])  Essential hypertension Blood pressure is well-controlled on losartan  25 mg daily, continue and watch blood pressure closely with higher dose of phentermine .     She was  informed of the importance of frequent follow up visits to maximize her success with intensive lifestyle modifications for her multiple health conditions.   ATTESTASTION STATEMENTS:  Reviewed by clinician on day of visit: allergies, medications, problem list, medical history, surgical history, family history, social history, and previous encounter notes pertinent to obesity diagnosis.      Darice Haddock, D.O. DABFM, DABOM [facility] [address] [place], [place] [address] [phone]

## 2024-07-11 ENCOUNTER — Ambulatory Visit: Admitting: Family Medicine

## 2024-07-11 ENCOUNTER — Encounter: Payer: Self-pay | Admitting: Family Medicine

## 2024-07-11 VITALS — BP 120/83 | HR 65 | Temp 98.0°F | Ht 64.5 in | Wt 266.0 lb

## 2024-07-11 DIAGNOSIS — E559 Vitamin D deficiency, unspecified: Secondary | ICD-10-CM | POA: Diagnosis not present

## 2024-07-11 DIAGNOSIS — Z6841 Body Mass Index (BMI) 40.0 and over, adult: Secondary | ICD-10-CM | POA: Diagnosis not present

## 2024-07-11 DIAGNOSIS — N1832 Chronic kidney disease, stage 3b: Secondary | ICD-10-CM

## 2024-07-11 DIAGNOSIS — M1711 Unilateral primary osteoarthritis, right knee: Secondary | ICD-10-CM | POA: Diagnosis not present

## 2024-07-11 DIAGNOSIS — I129 Hypertensive chronic kidney disease with stage 1 through stage 4 chronic kidney disease, or unspecified chronic kidney disease: Secondary | ICD-10-CM | POA: Diagnosis not present

## 2024-07-11 DIAGNOSIS — E88819 Insulin resistance, unspecified: Secondary | ICD-10-CM | POA: Diagnosis not present

## 2024-07-11 DIAGNOSIS — I10 Essential (primary) hypertension: Secondary | ICD-10-CM

## 2024-07-11 MED ORDER — PHENTERMINE HCL 37.5 MG PO TABS: 37.5000 mg | ORAL_TABLET | Freq: Every day | ORAL | 0 refills | Status: AC

## 2024-07-11 NOTE — Progress Notes (Signed)
 Office: [phone]  /  Fax: [phone]  WEIGHT SUMMARY AND BIOMETRICS  Starting Date: 07/28/22  Starting Weight: 307lb   Weight Lost Since Last Visit: 1lb   Vitals Temp: 98 F (36.7 C) BP: 120/83 Pulse Rate: 65 SpO2: 99 %   Body Composition  Body Fat %: 53.4 % Fat Mass (lbs): 142.4 lbs Muscle Mass (lbs): 118 lbs Total Body Water (lbs): 93 lbs Visceral Fat Rating : 19    HPI  Chief Complaint: OBESITY  Jenna Mccann is here to discuss her progress with her obesity treatment plan. She is on the the Category 1 Plan and states she is following her eating plan approximately 80 % of the time. She states she is exercising 30-45  minutes 5-7 times per week. She is on cat 3 meal plan  Interval History:  Since last office visit she is down 1 lb She is up 2 lb of muscle mass and down 2.6 lb of body fat since last visit  This gives her a net weight loss of 41 lb in 14 mos of medically supervised weight management This is a 13.3% TBW loss She has had more L hip pain due to gait changes from R knee arthritis She is walking less often, 25 min most days of the week She is doing some upper body weight training She is eating cat 3 meal plan most of the time She did change Phentermine  from 15 mg bid to 37.5 mg qAM over the past month She is craving sweets but is mindful and trying not to eat them  Pharmacotherapy: Phentermine  37.5 mg qAM Off Wegovy  in Oct due to lack of insurance coverage  PHYSICAL EXAM:  Blood pressure 120/83, pulse 65, temperature 98 F (36.7 C), height 5' 4.5 (1.638 m), weight 266 lb (120.7 kg), SpO2 99%. Body mass index is 44.95 kg/m.  General: She is overweight, cooperative, alert, well developed, and in no acute distress. PSYCH: Has normal mood, affect and thought process.   Lungs: Normal breathing effort, no conversational dyspnea.  ASSESSMENT AND PLAN  TREATMENT PLAN FOR OBESITY:  Recommended Dietary Goals  [name] is currently in the action  stage of change. As such, her goal is to continue weight management plan. She has agreed to the Category 2 Plan. Changing to cat 2 meal plan from cat 3 (1500 cal --> 1200 cal/ day intake) REE initially 2045 cal/ day  Behavioral Intervention  We discussed the following Behavioral Modification Strategies today: increasing lean protein intake to established goals, increasing vegetables, increasing water intake , work on meal planning and preparation, keeping healthy foods at home, avoiding temptations and identifying enticing environmental cues, continue to work on implementation of reduced calorie nutritional plan, continue to practice mindfulness when eating, and planning for success.  Additional resources provided today: NA  Recommended Physical Activity Goals  Everleigh has been advised to work up to 150 minutes of moderate intensity aerobic activity a week and strengthening exercises 2-3 times per week for cardiovascular health, weight loss maintenance and preservation of muscle mass.   She has agreed to Think about enjoyable ways to increase daily physical activity and overcoming barriers to exercise, Increase physical activity in their day and reduce sedentary time (increase NEAT)., and Increase the intensity, frequency or duration of strengthening exercises   Pharmacotherapy changes for the treatment of obesity: none  ASSOCIATED CONDITIONS ADDRESSED TODAY  CKD stage 3b, GFR 30-44 ml/min (HCC)   Chemistry      Component Value Date/Time   NA  138 12/30/2023 1203   K 4.3 12/30/2023 1203   CL 96 12/30/2023 1203   CO2 21 12/30/2023 1203   BUN 26 12/30/2023 1203   CREATININE 1.71 (H) 12/30/2023 1203      Component Value Date/Time   CALCIUM  10.1 12/30/2023 1203   ALKPHOS 69 12/30/2023 1203   AST 26 12/30/2023 1203   ALT 17 12/30/2023 1203   BILITOT 0.3 12/30/2023 1203     Repeat labs today -     Comprehensive metabolic panel with GFR  Morbid obesity (HCC) BP/ HR at goal on full  dose Phentermine  but weight loss less than expected this month.  More changes in body composition vs weight on scale.  Will change to cat 2 meal plan and obtain labs today.  Focus on chair exercises, weight training on days that walking is too painful.    Patient was counseled on the importance of maintaining healthy lifestyle habits, including balanced nutrition, regular physical activity, and behavioral modifications, while taking antiobesity medication.  Patient verbalized understanding that medication is an adjunct to, not a replacement for, lifestyle changes and that the long-term success and weight maintenance depend on continued adherence to these strategies.  -     Phentermine  HCl; Take 1 tablet (37.5 mg total) by mouth daily before breakfast.  Dispense: 30 tablet; Refill: 0  Insulin  resistance 04/28/23 fasting insulin  20.6 She has lost 13% TBW in the past 14 mos since then Continue to limit sugar and starch intake while increasing physical activity -     Insulin , random  Primary hypertension BP at goal Continue current meds per PCP -     Lipid panel  Vitamin D  deficiency Not on RX vitamin D  but is taking OTC calcium  + vitamin D  Repeat lab today -     VITAMIN D  25 Hydroxy (Vit-D Deficiency, Fractures)  BMI 40.0-44.9, adult (HCC)  Osteoarthritis of right knee, unspecified osteoarthritis type Worsening Seeing Dr Harden back to discuss R TKR Actively working on getting BMI <40 for TKR Exercise limited due to pain which has made it harder to see adequate weight loss Will change meal plan and keep Phentermine  on board for another month to see if she can get additional weight loss for TKR   She was informed of the importance of frequent follow up visits to maximize her success with intensive lifestyle modifications for her multiple health conditions.   ATTESTASTION STATEMENTS:  Reviewed by clinician on day of visit: allergies, medications, problem list, medical history, surgical  history, family history, social history, and previous encounter notes pertinent to obesity diagnosis.   I have personally spent 30 minutes total time today in preparation, patient care, nutritional counseling and education,  and documentation for this visit, including the following: review of most recent clinical lab tests, prescribing medications/ refilling medications, reviewing medical assistant documentation, review and interpretation of bioimpedence results.     Darice Haddock, D.O. DABFM, DABOM [facility] [address] [place], [place] [address] [phone]

## 2024-07-12 ENCOUNTER — Ambulatory Visit: Payer: Self-pay | Admitting: Family Medicine

## 2024-07-12 LAB — LIPID PANEL
Chol/HDL Ratio: 3.3 ratio (ref 0.0–4.4)
Cholesterol, Total: 186 mg/dL (ref 100–199)
HDL: 56 mg/dL (ref >39)
LDL Chol Calc (NIH): 113 mg/dL — ABNORMAL HIGH (ref 0–99)
Triglycerides: 93 mg/dL (ref 0–149)
VLDL Cholesterol Cal: 17 mg/dL (ref 5–40)

## 2024-07-12 LAB — COMPREHENSIVE METABOLIC PANEL WITH GFR
ALT: 14 IU/L (ref 0–32)
AST: 24 IU/L (ref 0–40)
Albumin: 4.1 g/dL (ref 3.9–4.9)
Alkaline Phosphatase: 78 IU/L (ref 49–135)
BUN/Creatinine Ratio: 18 (ref 12–28)
BUN: 26 mg/dL (ref 8–27)
Bilirubin Total: 0.3 mg/dL (ref 0.0–1.2)
CO2: 27 mmol/L (ref 20–29)
Calcium: 9.6 mg/dL (ref 8.7–10.3)
Chloride: 96 mmol/L (ref 96–106)
Creatinine, Ser: 1.45 mg/dL — ABNORMAL HIGH (ref 0.57–1.00)
Globulin, Total: 2.7 g/dL (ref 1.5–4.5)
Glucose: 82 mg/dL (ref 70–99)
Potassium: 4.7 mmol/L (ref 3.5–5.2)
Sodium: 138 mmol/L (ref 134–144)
Total Protein: 6.8 g/dL (ref 6.0–8.5)
eGFR: 41 mL/min/1.73 — ABNORMAL LOW (ref >59)

## 2024-07-12 LAB — VITAMIN D 25 HYDROXY (VIT D DEFICIENCY, FRACTURES): Vit D, 25-Hydroxy: 52.8 ng/mL (ref 30.0–100.0)

## 2024-07-12 LAB — INSULIN, RANDOM: INSULIN: 19.4 u[IU]/mL (ref 2.6–24.9)

## 2024-08-02 ENCOUNTER — Ambulatory Visit: Admitting: Orthopedic Surgery

## 2024-08-15 ENCOUNTER — Ambulatory Visit: Admitting: Family Medicine

## 2024-08-22 ENCOUNTER — Ambulatory Visit: Admitting: Family Medicine

## 2024-08-29 ENCOUNTER — Ambulatory Visit: Admitting: Family Medicine

## 2024-09-05 ENCOUNTER — Ambulatory Visit: Admitting: Family Medicine

## 2024-10-03 ENCOUNTER — Ambulatory Visit: Admitting: Orthopedic Surgery
